# Patient Record
Sex: Male | Born: 1990 | Race: Black or African American | Hispanic: No | Marital: Single | Smoking: Current every day smoker
Health system: Southern US, Community
[De-identification: ages and names within clinical notes are randomized; demographics above are authoritative.]

## PROBLEM LIST (undated history)

## (undated) DIAGNOSIS — R569 Unspecified convulsions: Secondary | ICD-10-CM

## (undated) HISTORY — PX: NO PAST SURGERIES: SHX2092

---

## 2013-07-30 ENCOUNTER — Emergency Department (HOSPITAL_COMMUNITY)
Admission: EM | Admit: 2013-07-30 | Discharge: 2013-07-30 | Disposition: A | Discharge status: Home / Self Care | Payer: 59 | Attending: Emergency Medicine | Admitting: Emergency Medicine

## 2013-07-30 ENCOUNTER — Encounter (HOSPITAL_COMMUNITY): Payer: Self-pay | Admitting: Emergency Medicine

## 2013-07-30 DIAGNOSIS — Y939 Activity, unspecified: Secondary | ICD-10-CM | POA: Insufficient documentation

## 2013-07-30 DIAGNOSIS — IMO0002 Reserved for concepts with insufficient information to code with codable children: Secondary | ICD-10-CM | POA: Insufficient documentation

## 2013-07-30 DIAGNOSIS — Y929 Unspecified place or not applicable: Secondary | ICD-10-CM | POA: Insufficient documentation

## 2013-07-30 DIAGNOSIS — F17200 Nicotine dependence, unspecified, uncomplicated: Secondary | ICD-10-CM | POA: Insufficient documentation

## 2013-07-30 DIAGNOSIS — R296 Repeated falls: Secondary | ICD-10-CM | POA: Insufficient documentation

## 2013-07-30 DIAGNOSIS — R112 Nausea with vomiting, unspecified: Secondary | ICD-10-CM | POA: Insufficient documentation

## 2013-07-30 DIAGNOSIS — R569 Unspecified convulsions: Secondary | ICD-10-CM | POA: Insufficient documentation

## 2013-07-30 HISTORY — DX: Unspecified convulsions: R56.9

## 2013-07-30 LAB — I-STAT CHEM 8, ED
BUN: 12 mg/dL (ref 6–23)
Calcium, Ion: 1.12 mmol/L (ref 1.12–1.23)
Chloride: 105 mEq/L (ref 96–112)
Creatinine, Ser: 1 mg/dL (ref 0.50–1.35)
Glucose, Bld: 82 mg/dL (ref 70–99)
HCT: 46 % (ref 39.0–52.0)
Hemoglobin: 15.6 g/dL (ref 13.0–17.0)
Potassium: 3.7 mEq/L (ref 3.7–5.3)
Sodium: 141 mEq/L (ref 137–147)
TCO2: 22 mmol/L (ref 0–100)

## 2013-07-30 MED ORDER — KETOROLAC TROMETHAMINE 30 MG/ML IJ SOLN
30.0000 mg | Freq: Once | INTRAMUSCULAR | Status: AC
  Administered 2013-07-30: 30 mg via INTRAVENOUS
  Filled 2013-07-30: qty 1

## 2013-07-30 MED ORDER — LEVETIRACETAM 500 MG PO TABS: 500.0000 mg | ORAL_TABLET | Freq: Two times a day (BID) | ORAL | Status: DC

## 2013-07-30 MED ORDER — ONDANSETRON HCL 4 MG/2ML IJ SOLN
4.0000 mg | Freq: Once | INTRAMUSCULAR | Status: AC
  Administered 2013-07-30: 4 mg via INTRAVENOUS
  Filled 2013-07-30: qty 2

## 2013-07-30 MED ORDER — LEVETIRACETAM 500 MG PO TABS
1000.0000 mg | ORAL_TABLET | Freq: Once | ORAL | Status: AC
  Administered 2013-07-30: 1000 mg via ORAL
  Filled 2013-07-30 (×2): qty 2

## 2013-07-30 MED ORDER — SODIUM CHLORIDE 0.9 % IV BOLUS (SEPSIS)
1000.0000 mL | Freq: Once | INTRAVENOUS | Status: AC
  Administered 2013-07-30: 1000 mL via INTRAVENOUS

## 2013-07-30 NOTE — ED Notes (Signed)
Bed: WA07 Expected date:  Expected time:  Means of arrival:  Comments: NV, zofran given

## 2013-07-30 NOTE — Discharge Instructions (Signed)
Driving and Equipment Restrictions Some medical problems make it dangerous to drive, ride a bike, or use machines. Some of these problems are:  A hard blow to the head (concussion).  Passing out (fainting).  Twitching and shaking (seizures).  Low blood sugar.  Taking medicine to help you relax (sedatives).  Taking pain medicines.  Wearing an eye patch.  Wearing splints. This can make it hard to use parts of your body that you need to drive safely. HOME CARE   Do not drive until your doctor says it is okay.  Do not use machines until your doctor says it is okay. You may need a form signed by your doctor (medical release) before you can drive again. You may also need this form before you do other tasks where you need to be fully alert. MAKE SURE YOU:  Understand these instructions.  Will watch your condition.  Will get help right away if you are not doing well or get worse. Document Released: 04/28/2004 Document Revised: 06/13/2011 Document Reviewed: 07/29/2009 [facility] Patient Information 2014 [place]ExitCare, [place].  Seizure, Adult A seizure means there is unusual activity in the brain. A seizure can cause changes in attention or behavior. Seizures often cause shaking (convulsions). Seizures often last from 30 seconds to 2 minutes. HOME CARE   If you are given medicines, take them exactly as told by your doctor.  Keep all doctor visits as told.  Do not swim or drive until your doctor says it is okay.  Teach others what to do if you have a seizure. They should:  Lay you on the ground.  Put a cushion under your head.  Loosen any tight clothing around your neck.  Turn you on your side.  Stay with you until you get better. GET HELP RIGHT AWAY IF:   The seizure lasts longer than 2 to 5 minutes.  The seizure is very bad.  The person does not wake up after the seizure.  The person's attention or behavior changes. Drive the person to the emergency room or call your  local emergency services (911 in [place] MAKE SURE YOU:   Understand these instructions.  Will watch your condition.  Will get help right away if you are not doing well or get worse. Document Released: 09/07/2007 Document Revised: 06/13/2011 Document Reviewed: 03/09/2011 [facility] Patient Information 2014 [place], [place].

## 2013-07-30 NOTE — Progress Notes (Signed)
  CARE MANAGEMENT ED NOTE 07/30/2013  Patient:  Jesus Bryan,Jesus Bryan   Account Number:  [id number]  Date Initiated:  07/30/2013  Documentation initiated by:  Radford PaxFERRERO,Melroy Bougher  Subjective/Objective Assessment:   Patient presents to Ed post seizure     Subjective/Objective Assessment Detail:     Action/Plan:   Action/Plan Detail:   Anticipated DC Date:  07/30/2013     Status Recommendation to Physician:   Result of Recommendation:    Other ED Services  Consult Working Plan    DC Planning Services  Other  PCP issues    Choice offered to / List presented to:            Status of service:  Completed, signed off  ED Comments:   ED Comments Detail:  EDCM spoke to patient at bedside.  EDCM has noted patient's Medicaid is out of state.  [facility] he will need to call the DSS to have his Medicaid insurnace changed to [place].  However, the patient seems to be having difficulty in finding his words post seizure.  Patient reaching for his wallet and calling it water.  EDRN in the room providing patient pain and nausea medicine.  Patient will need reinforcement.  No further EDCM needs at this time.

## 2013-07-30 NOTE — ED Provider Notes (Signed)
CSN: [id number]     Arrival date & time 07/30/13  1746 History   First MD Initiated Contact with Patient 07/30/13 1810     Chief Complaint  Patient presents with  . Seizures  . Nausea  . Emesis     (Consider location/radiation/quality/duration/timing/severity/associated sxs/prior Treatment) HPI  23 year old male brought in by EMS after likely seizure. He has a history the same. Patient was briefly incarcerated for a few days and during this time did not take his Keppra. He fell during this event is has abrasion to his chin. Aside from some mild pain there, he currently has no complaints. No fever or chills. No HA. No neck pain/stiffness.   Past Medical History  Diagnosis Date  . Seizures    History reviewed. No pertinent past surgical history. No family history on file. History  Substance Use Topics  . Smoking status: Current Some Day Smoker  . Smokeless tobacco: Not on file  . Alcohol Use: Yes     Comment: occasionally     Review of Systems  All systems reviewed and negative, other than as noted in HPI.   Allergies  Review of patient's allergies indicates no known allergies.  Home Medications   Prior to Admission medications   Not on File   BP 121/68  Pulse 60  Temp(Src) 99.1 F (37.3 C) (Oral)  Resp 16  SpO2 100% Physical Exam  Nursing note and vitals reviewed. Constitutional: He is oriented to person, place, and time. He appears well-developed and well-nourished. No distress.  HENT:  Head: Normocephalic.  Abrasion to the chin. No significant bony tenderness.  Eyes: Conjunctivae are normal. Right eye exhibits no discharge. Left eye exhibits no discharge.  Neck: Neck supple.  No nuchal rigidity  Cardiovascular: Normal rate, regular rhythm and normal heart sounds.  Exam reveals no gallop and no friction rub.   No murmur heard. Pulmonary/Chest: Effort normal and breath sounds normal. No respiratory distress.  Abdominal: Soft. He exhibits no distension.  There is no tenderness.  Musculoskeletal: He exhibits no edema and no tenderness.  Neurological: He is alert and oriented to person, place, and time. No cranial nerve deficit. He exhibits normal muscle tone. Coordination normal.  Speech clear. Content appropriate. Follows commands. Cranial nerves intact. Strength 5 out of 5 bilateral upper lower extremities. Good units testing bilaterally. Gait is steady.  Skin: Skin is warm and dry.  Psychiatric: He has a normal mood and affect. His behavior is normal. Thought content normal.    ED Course  Procedures (including critical care time) Labs Review Labs Reviewed  I-STAT CHEM 8, ED    Imaging Review No results found.   EKG Interpretation None      MDM   Final diagnoses:  Seizure    23 year old male with a seizure. History the same. Back to baseline. Nonfocal neuro exam. Likely secondary to medication noncompliance while incarcerated. She was loaded with Keppra the emergency room. He is provided with a prescription. Return precautions were discussed. Outpatient followup otherwise.    Raeford RazorStephen Shaquella Stamant, MD 08/10/13 2018

## 2013-07-30 NOTE — ED Notes (Signed)
Pt reports that he has been in jail for 2 days and was not give his Keppra.
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
## 2013-10-17 ENCOUNTER — Emergency Department (HOSPITAL_COMMUNITY): Payer: Medicaid Other

## 2013-10-17 ENCOUNTER — Ambulatory Visit (HOSPITAL_COMMUNITY)
Admission: EM | Admit: 2013-10-17 | Discharge: 2013-10-17 | Disposition: A | Discharge status: Home / Self Care | Payer: Medicaid Other | Attending: Emergency Medicine | Admitting: Emergency Medicine

## 2013-10-17 ENCOUNTER — Encounter (HOSPITAL_COMMUNITY): Payer: Self-pay | Admitting: Emergency Medicine

## 2013-10-17 ENCOUNTER — Encounter (HOSPITAL_COMMUNITY)
Admission: EM | Disposition: A | Discharge status: Home / Self Care | Payer: Self-pay | Source: Home / Self Care | Attending: Emergency Medicine

## 2013-10-17 ENCOUNTER — Encounter (HOSPITAL_COMMUNITY): Payer: Medicaid Other | Admitting: Anesthesiology

## 2013-10-17 ENCOUNTER — Emergency Department (HOSPITAL_COMMUNITY): Payer: Medicaid Other | Admitting: Anesthesiology

## 2013-10-17 DIAGNOSIS — S61411A Laceration without foreign body of right hand, initial encounter: Secondary | ICD-10-CM

## 2013-10-17 DIAGNOSIS — S66921A Laceration of unspecified muscle, fascia and tendon at wrist and hand level, right hand, initial encounter: Secondary | ICD-10-CM

## 2013-10-17 DIAGNOSIS — S61412A Laceration without foreign body of left hand, initial encounter: Secondary | ICD-10-CM

## 2013-10-17 DIAGNOSIS — Z79899 Other long term (current) drug therapy: Secondary | ICD-10-CM | POA: Insufficient documentation

## 2013-10-17 DIAGNOSIS — S65509A Unspecified injury of blood vessel of unspecified finger, initial encounter: Secondary | ICD-10-CM | POA: Insufficient documentation

## 2013-10-17 DIAGNOSIS — S61209A Unspecified open wound of unspecified finger without damage to nail, initial encounter: Secondary | ICD-10-CM | POA: Diagnosis not present

## 2013-10-17 DIAGNOSIS — F17200 Nicotine dependence, unspecified, uncomplicated: Secondary | ICD-10-CM | POA: Insufficient documentation

## 2013-10-17 HISTORY — DX: Unspecified convulsions: R56.9

## 2013-10-17 HISTORY — PX: NERVE, TENDON AND ARTERY REPAIR: SHX5695

## 2013-10-17 LAB — BASIC METABOLIC PANEL
Anion gap: 19 — ABNORMAL HIGH (ref 5–15)
BUN: 11 mg/dL (ref 6–23)
CO2: 21 mEq/L (ref 19–32)
Calcium: 9.6 mg/dL (ref 8.4–10.5)
Chloride: 102 mEq/L (ref 96–112)
Creatinine, Ser: 1.13 mg/dL (ref 0.50–1.35)
GFR calc Af Amer: >90 mL/min (ref >90)
GFR calc non Af Amer: 90 mL/min — ABNORMAL LOW (ref >90)
GLUCOSE: 106 mg/dL — AB (ref 70–99)
POTASSIUM: 3.3 meq/L — AB (ref 3.7–5.3)
SODIUM: 142 meq/L (ref 137–147)

## 2013-10-17 LAB — CBC WITH DIFFERENTIAL/PLATELET
BASOS ABS: 0.1 10*3/uL (ref 0.0–0.1)
Basophils Relative: 1 % (ref 0–1)
EOS ABS: 0 10*3/uL (ref 0.0–0.7)
EOS PCT: 0 % (ref 0–5)
HCT: 37.1 % — ABNORMAL LOW (ref 39.0–52.0)
Hemoglobin: 13 g/dL (ref 13.0–17.0)
LYMPHS ABS: 3.5 10*3/uL (ref 0.7–4.0)
LYMPHS PCT: 47 % — AB (ref 12–46)
MCH: 29.1 pg (ref 26.0–34.0)
MCHC: 35 g/dL (ref 30.0–36.0)
MCV: 83.2 fL (ref 78.0–100.0)
Monocytes Absolute: 0.6 10*3/uL (ref 0.1–1.0)
Monocytes Relative: 7 % (ref 3–12)
NEUTROS PCT: 45 % (ref 43–77)
Neutro Abs: 3.3 10*3/uL (ref 1.7–7.7)
PLATELETS: 231 10*3/uL (ref 150–400)
RBC: 4.46 MIL/uL (ref 4.22–5.81)
RDW: 12.9 % (ref 11.5–15.5)
WBC: 7.4 10*3/uL (ref 4.0–10.5)

## 2013-10-17 LAB — HEMOGLOBIN AND HEMATOCRIT, BLOOD
HEMATOCRIT: 29.7 % — AB (ref 39.0–52.0)
Hemoglobin: 10.7 g/dL — ABNORMAL LOW (ref 13.0–17.0)

## 2013-10-17 SURGERY — NERVE, TENDON AND ARTERY REPAIR
Anesthesia: General | Site: Hand | Laterality: Bilateral

## 2013-10-17 MED ORDER — GLYCOPYRROLATE 0.2 MG/ML IJ SOLN
INTRAMUSCULAR | Status: DC | PRN
  Administered 2013-10-17: 0.4 mg via INTRAVENOUS

## 2013-10-17 MED ORDER — MORPHINE SULFATE 4 MG/ML IJ SOLN
4.0000 mg | Freq: Once | INTRAMUSCULAR | Status: AC
  Administered 2013-10-17: 4 mg via INTRAVENOUS
  Filled 2013-10-17: qty 1

## 2013-10-17 MED ORDER — ONDANSETRON HCL 4 MG/2ML IJ SOLN
INTRAMUSCULAR | Status: AC
  Filled 2013-10-17: qty 2

## 2013-10-17 MED ORDER — CEFAZOLIN SODIUM-DEXTROSE 2-3 GM-% IV SOLR
INTRAVENOUS | Status: AC
  Filled 2013-10-17: qty 100

## 2013-10-17 MED ORDER — PHENYLEPHRINE HCL 10 MG/ML IJ SOLN
10.0000 mg | INTRAVENOUS | Status: DC | PRN
  Administered 2013-10-17: 50 ug/min via INTRAVENOUS

## 2013-10-17 MED ORDER — LIDOCAINE HCL (PF) 1 % IJ SOLN
INTRAMUSCULAR | Status: AC
  Filled 2013-10-17: qty 30

## 2013-10-17 MED ORDER — ROCURONIUM BROMIDE 100 MG/10ML IV SOLN
INTRAVENOUS | Status: DC | PRN
  Administered 2013-10-17: 40 mg via INTRAVENOUS

## 2013-10-17 MED ORDER — 0.9 % SODIUM CHLORIDE (POUR BTL) OPTIME
TOPICAL | Status: DC | PRN
  Administered 2013-10-17 (×3): 1000 mL

## 2013-10-17 MED ORDER — MIDAZOLAM HCL 2 MG/2ML IJ SOLN
INTRAMUSCULAR | Status: AC
  Filled 2013-10-17: qty 2

## 2013-10-17 MED ORDER — HEPARIN SODIUM (PORCINE) 1000 UNIT/ML IJ SOLN
INTRAMUSCULAR | Status: AC
  Filled 2013-10-17: qty 1

## 2013-10-17 MED ORDER — BUPIVACAINE HCL (PF) 0.25 % IJ SOLN
INTRAMUSCULAR | Status: DC | PRN
  Administered 2013-10-17: 17 mL

## 2013-10-17 MED ORDER — NEOSTIGMINE METHYLSULFATE 10 MG/10ML IV SOLN
INTRAVENOUS | Status: AC
  Filled 2013-10-17: qty 1

## 2013-10-17 MED ORDER — PROPOFOL 10 MG/ML IV BOLUS
INTRAVENOUS | Status: DC | PRN
  Administered 2013-10-17: 60 mg via INTRAVENOUS
  Administered 2013-10-17: 140 mg via INTRAVENOUS

## 2013-10-17 MED ORDER — MIDAZOLAM HCL 5 MG/5ML IJ SOLN
INTRAMUSCULAR | Status: DC | PRN
  Administered 2013-10-17: 2 mg via INTRAVENOUS

## 2013-10-17 MED ORDER — HYDROMORPHONE HCL PF 1 MG/ML IJ SOLN
INTRAMUSCULAR | Status: AC
  Filled 2013-10-17: qty 1

## 2013-10-17 MED ORDER — NEOSTIGMINE METHYLSULFATE 10 MG/10ML IV SOLN
INTRAVENOUS | Status: DC | PRN
  Administered 2013-10-17: 3 mg via INTRAVENOUS

## 2013-10-17 MED ORDER — BUPIVACAINE HCL (PF) 0.25 % IJ SOLN
INTRAMUSCULAR | Status: AC
  Filled 2013-10-17: qty 30

## 2013-10-17 MED ORDER — CEFAZOLIN SODIUM 1-5 GM-% IV SOLN
1.0000 g | Freq: Once | INTRAVENOUS | Status: AC
  Administered 2013-10-17: 1 g via INTRAVENOUS
  Filled 2013-10-17: qty 50

## 2013-10-17 MED ORDER — OXYCODONE-ACETAMINOPHEN 5-325 MG PO TABS: ORAL_TABLET | ORAL | Status: DC

## 2013-10-17 MED ORDER — ARTIFICIAL TEARS OP OINT
TOPICAL_OINTMENT | OPHTHALMIC | Status: AC
  Filled 2013-10-17: qty 3.5

## 2013-10-17 MED ORDER — TETANUS-DIPHTH-ACELL PERTUSSIS 5-2.5-18.5 LF-MCG/0.5 IM SUSP
0.5000 mL | Freq: Once | INTRAMUSCULAR | Status: AC
  Administered 2013-10-17: 0.5 mL via INTRAMUSCULAR
  Filled 2013-10-17: qty 0.5

## 2013-10-17 MED ORDER — PHENYLEPHRINE HCL 10 MG/ML IJ SOLN
INTRAMUSCULAR | Status: DC | PRN
  Administered 2013-10-17 (×2): 80 ug via INTRAVENOUS
  Administered 2013-10-17: 120 ug via INTRAVENOUS
  Administered 2013-10-17: 80 ug via INTRAVENOUS
  Administered 2013-10-17: 40 ug via INTRAVENOUS

## 2013-10-17 MED ORDER — EPHEDRINE SULFATE 50 MG/ML IJ SOLN
INTRAMUSCULAR | Status: DC | PRN
  Administered 2013-10-17 (×2): 5 mg via INTRAVENOUS

## 2013-10-17 MED ORDER — LACTATED RINGERS IV SOLN
INTRAVENOUS | Status: DC | PRN
  Administered 2013-10-17 (×3): via INTRAVENOUS

## 2013-10-17 MED ORDER — ONDANSETRON HCL 4 MG/2ML IJ SOLN
INTRAMUSCULAR | Status: DC | PRN
  Administered 2013-10-17: 4 mg via INTRAVENOUS

## 2013-10-17 MED ORDER — PROPOFOL 10 MG/ML IV BOLUS
INTRAVENOUS | Status: AC
  Filled 2013-10-17: qty 20

## 2013-10-17 MED ORDER — FENTANYL CITRATE 0.05 MG/ML IJ SOLN
INTRAMUSCULAR | Status: DC | PRN
  Administered 2013-10-17 (×2): 100 ug via INTRAVENOUS
  Administered 2013-10-17: 50 ug via INTRAVENOUS

## 2013-10-17 MED ORDER — GLYCOPYRROLATE 0.2 MG/ML IJ SOLN
INTRAMUSCULAR | Status: AC
  Filled 2013-10-17: qty 2

## 2013-10-17 MED ORDER — CEFAZOLIN SODIUM-DEXTROSE 2-3 GM-% IV SOLR
INTRAVENOUS | Status: DC | PRN
  Administered 2013-10-17: 2 g via INTRAVENOUS

## 2013-10-17 MED ORDER — SODIUM CHLORIDE 0.9 % IV BOLUS (SEPSIS)
1000.0000 mL | Freq: Once | INTRAVENOUS | Status: AC
  Administered 2013-10-17: 1000 mL via INTRAVENOUS

## 2013-10-17 MED ORDER — HYDROMORPHONE HCL PF 1 MG/ML IJ SOLN
INTRAMUSCULAR | Status: DC | PRN
  Administered 2013-10-17: 1 mg via INTRAVENOUS

## 2013-10-17 MED ORDER — LIDOCAINE HCL (PF) 1 % IJ SOLN
INTRAVENOUS | Status: DC
  Filled 2013-10-17: qty 10

## 2013-10-17 MED ORDER — LIDOCAINE HCL (CARDIAC) 20 MG/ML IV SOLN
INTRAVENOUS | Status: DC | PRN
  Administered 2013-10-17: 50 mg via INTRAVENOUS

## 2013-10-17 MED ORDER — ONDANSETRON HCL 4 MG/2ML IJ SOLN: INTRAMUSCULAR | Status: DC | PRN

## 2013-10-17 MED ORDER — ROCURONIUM BROMIDE 50 MG/5ML IV SOLN
INTRAVENOUS | Status: AC
  Filled 2013-10-17: qty 1

## 2013-10-17 MED ORDER — PHENYLEPHRINE HCL 10 MG/ML IJ SOLN
INTRAMUSCULAR | Status: AC
  Filled 2013-10-17: qty 1

## 2013-10-17 MED ORDER — ASPIRIN EC 325 MG PO TBEC: 325.0000 mg | DELAYED_RELEASE_TABLET | Freq: Every day | ORAL | Status: DC

## 2013-10-17 MED ORDER — FENTANYL CITRATE 0.05 MG/ML IJ SOLN
INTRAMUSCULAR | Status: AC
  Filled 2013-10-17: qty 5

## 2013-10-17 SURGICAL SUPPLY — 37 items
BANDAGE ELASTIC 4 VELCRO ST LF (GAUZE/BANDAGES/DRESSINGS) ×6 IMPLANT
BANDAGE GAUZE ELAST BULKY 4 IN (GAUZE/BANDAGES/DRESSINGS) ×6 IMPLANT
BLADE MINI RND TIP GREEN BEAV (BLADE) IMPLANT
BLADE SURG 15 STRL LF DISP TIS (BLADE) IMPLANT
BLADE SURG 15 STRL SS (BLADE)
BNDG ESMARK 4X9 LF (GAUZE/BANDAGES/DRESSINGS) ×3 IMPLANT
BNDG GAUZE ELAST 4 BULKY (GAUZE/BANDAGES/DRESSINGS) ×3 IMPLANT
CORDS BIPOLAR (ELECTRODE) ×3 IMPLANT
CUFF TOURNIQUET SINGLE 18IN (TOURNIQUET CUFF) ×3 IMPLANT
DECANTER SPIKE VIAL GLASS SM (MISCELLANEOUS) IMPLANT
DRAPE EXTREMITY T 121X128X90 (DRAPE) ×3 IMPLANT
DRAPE SURG 17X23 STRL (DRAPES) ×3 IMPLANT
GAUZE XEROFORM 1X8 LF (GAUZE/BANDAGES/DRESSINGS) ×6 IMPLANT
GLOVE BIO SURGEON STRL SZ7.5 (GLOVE) ×3 IMPLANT
GLOVE BIOGEL PI IND STRL 8 (GLOVE) ×1 IMPLANT
GLOVE BIOGEL PI INDICATOR 8 (GLOVE) ×2
GOWN STRL REUS W/ TWL XL LVL3 (GOWN DISPOSABLE) ×1 IMPLANT
GOWN STRL REUS W/TWL XL LVL3 (GOWN DISPOSABLE) ×2
KIT BASIN OR (CUSTOM PROCEDURE TRAY) ×3 IMPLANT
NDL SAFETY ECLIPSE 18X1.5 (NEEDLE) IMPLANT
NEEDLE HYPO 18GX1.5 SHARP (NEEDLE)
NEEDLE HYPO 25X1 1.5 SAFETY (NEEDLE) IMPLANT
NS IRRIG 1000ML POUR BTL (IV SOLUTION) ×3 IMPLANT
PACK ORTHO EXTREMITY (CUSTOM PROCEDURE TRAY) ×3 IMPLANT
SPEAR EYE SURG WECK-CEL (MISCELLANEOUS) ×3 IMPLANT
SPONGE GAUZE 4X4 12PLY (GAUZE/BANDAGES/DRESSINGS) ×6 IMPLANT
STOCKINETTE 4X48 STRL (DRAPES) IMPLANT
SUT ETHILON 4 0 P 3 18 (SUTURE) ×3 IMPLANT
SUT ETHILON 4 0 PS 2 18 (SUTURE) ×6 IMPLANT
SUT ETHILON 9 0 BV130 4 (SUTURE) ×9 IMPLANT
SUT MERSILENE 4 0 P 3 (SUTURE) ×3 IMPLANT
SUT VIC AB 3-0 SH 27 (SUTURE) ×2
SUT VIC AB 3-0 SH 27XBRD (SUTURE) ×1 IMPLANT
SUT VIC AB 3-0 X1 27 (SUTURE) ×3 IMPLANT
SYR BULB 3OZ (MISCELLANEOUS) ×3 IMPLANT
TOWEL OR 17X24 6PK STRL BLUE (TOWEL DISPOSABLE) ×6 IMPLANT
UNDERPAD 30X30 INCONTINENT (UNDERPADS AND DIAPERS) ×6 IMPLANT

## 2013-10-17 NOTE — Brief Op Note (Signed)
10/17/2013  7:47 PM  PATIENT:  Walker KehrWilly Xxxwelson  23 y.o. male  PRE-OPERATIVE DIAGNOSIS:  bilateral hand lacerations  POST-OPERATIVE DIAGNOSIS:  bilateral hand lacerations  PROCEDURE:  Right hand exploration wound, repair princeps pollicis artery x 2, repair thenar musculature; left hand exploration wound, repair EDC to small finger and EDC to ring finger at dorsum of mp joints including ulnar sagittal bands  SURGEON:  Surgeon(s) and Role:    * Tami RibasKevin R Dmarius Reeder, MD - Primary    * Marlowe ShoresMatthew A Weingold, MD - Assisting  PHYSICIAN ASSISTANT:   ASSISTANTS: Dairl PonderMatthew Weingold, MD   ANESTHESIA:   general  EBL:     BLOOD ADMINISTERED:none  DRAINS: none   LOCAL MEDICATIONS USED:  MARCAINE     SPECIMEN:  No Specimen  DISPOSITION OF SPECIMEN:  N/A  COUNTS:  YES  TOURNIQUET:   Total Tourniquet Time Documented: Upper Arm (Right) - 89 minutes Total: Upper Arm (Right) - 89 minutes   DICTATION: .Other Dictation: Dictation Number [id number]  PLAN OF CARE: Discharge to home after PACU  PATIENT DISPOSITION:  PACU - hemodynamically stable.
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
## 2013-10-17 NOTE — Discharge Instructions (Signed)
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
## 2013-10-17 NOTE — Anesthesia Preprocedure Evaluation (Signed)
Anesthesia Evaluation  Patient identified by MRN, date of birth, ID band Patient awake    Reviewed: Allergy & Precautions, H&P , NPO status , Patient's Chart, lab work & pertinent test results  History of Anesthesia Complications Negative for: history of anesthetic complications  Airway Mallampati: I TM Distance: >3 FB Neck ROM: Full    Dental  (+) Teeth Intact,    Pulmonary neg shortness of breath, neg sleep apnea, neg COPDneg recent URI, Current Smoker,  breath sounds clear to auscultation        Cardiovascular negative cardio ROS  Rhythm:Regular     Neuro/Psych Seizures -, Well Controlled,  negative psych ROS   GI/Hepatic negative GI ROS, Neg liver ROS,   Endo/Other  negative endocrine ROS  Renal/GU negative Renal ROS     Musculoskeletal   Abdominal   Peds  Hematology negative hematology ROS (+)   Anesthesia Other Findings   Reproductive/Obstetrics                           Anesthesia Physical Anesthesia Plan  ASA: II  Anesthesia Plan: General   Post-op Pain Management:    Induction: Intravenous  Airway Management Planned: LMA and Oral ETT  Additional Equipment: None  Intra-op Plan:   Post-operative Plan: Extubation in OR  Informed Consent: I have reviewed the patients History and Physical, chart, labs and discussed the procedure including the risks, benefits and alternatives for the proposed anesthesia with the patient or authorized representative who has indicated his/her understanding and acceptance.   Dental advisory given  Plan Discussed with: CRNA and Surgeon  Anesthesia Plan Comments:         Anesthesia Quick Evaluation

## 2013-10-17 NOTE — Op Note (Signed)
[id number] 

## 2013-10-17 NOTE — Progress Notes (Signed)
Social work paged again

## 2013-10-17 NOTE — ED Notes (Signed)
Pneumatic tourniquet inflated.
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
## 2013-10-17 NOTE — Anesthesia Postprocedure Evaluation (Signed)
  Anesthesia Post-op Note  Patient: Jesus Bryan  Procedure(s) Performed: Procedure(s): EXPLORATION BILATERAL HAND LACERATIONS, RIGHT HAND REPAIR OF PRINCEPS POLLICIS ARTERY X2 AND REPAIR THENAR MUSCULATURE.  LEFT HAND REPAIR OF SMALL AND RING FINGER EXTENSOR TENDONS. (Bilateral)  Patient Location: PACU  Anesthesia Type: General   Level of Consciousness: awake, alert  and oriented  Airway and Oxygen Therapy: Patient Spontanous Breathing  Post-op Pain: None   Post-op Assessment: Post-op Vital signs reviewed  Post-op Vital Signs: Reviewed  Last Vitals:  Filed Vitals:   10/17/13 2130  BP: 121/70  Pulse: 71  Temp:   Resp: 13    Complications: No apparent anesthesia complications

## 2013-10-17 NOTE — ED Notes (Signed)
Dr Kuzma at bedside. 

## 2013-10-17 NOTE — ED Notes (Signed)
Pneumatic cuff deflated. Distal pulse intact. Bleeding controlled, dressing applied.

## 2013-10-17 NOTE — Transfer of Care (Signed)
Immediate Anesthesia Transfer of Care Note  Patient: Jesus Bryan  Procedure(s) Performed: Procedure(s): EXPLORATION BILATERAL HAND LACERATIONS,  (Bilateral)  Patient Location: PACU  Anesthesia Type:General  Level of Consciousness: responds to stimulation  Airway & Oxygen Therapy: Patient Spontanous Breathing and Patient connected to nasal cannula oxygen  Post-op Assessment: Report given to PACU RN, Post -op Vital signs reviewed and stable and Patient moving all extremities  Post vital signs: Reviewed and stable  Complications: No apparent anesthesia complications

## 2013-10-18 ENCOUNTER — Encounter (HOSPITAL_COMMUNITY): Payer: Self-pay | Admitting: Orthopedic Surgery
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
## 2013-10-18 NOTE — Addendum Note (Signed)
Addendum created 10/18/13 1227 by Corky Soxhris Darien Kading, MD   Modules edited: Anesthesia Attestations
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
## 2013-10-21 ENCOUNTER — Encounter (HOSPITAL_COMMUNITY): Payer: Self-pay | Admitting: Emergency Medicine

## 2013-10-21 DIAGNOSIS — Z4801 Encounter for change or removal of surgical wound dressing: Secondary | ICD-10-CM | POA: Diagnosis not present

## 2013-10-21 DIAGNOSIS — G40909 Epilepsy, unspecified, not intractable, without status epilepticus: Secondary | ICD-10-CM | POA: Diagnosis not present

## 2013-10-21 DIAGNOSIS — F17200 Nicotine dependence, unspecified, uncomplicated: Secondary | ICD-10-CM | POA: Diagnosis not present

## 2013-10-21 DIAGNOSIS — M79609 Pain in unspecified limb: Secondary | ICD-10-CM | POA: Diagnosis present

## 2013-10-21 DIAGNOSIS — Z79899 Other long term (current) drug therapy: Secondary | ICD-10-CM | POA: Diagnosis not present
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
## 2013-10-22 ENCOUNTER — Encounter (HOSPITAL_COMMUNITY): Payer: Self-pay | Admitting: Orthopedic Surgery

## 2013-10-22 ENCOUNTER — Emergency Department (HOSPITAL_COMMUNITY)
Admission: EM | Admit: 2013-10-22 | Discharge: 2013-10-22 | Disposition: A | Discharge status: Home / Self Care | Payer: Medicaid Other | Attending: Emergency Medicine | Admitting: Emergency Medicine

## 2013-10-22 DIAGNOSIS — Z5189 Encounter for other specified aftercare: Secondary | ICD-10-CM
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
## 2013-10-22 NOTE — ED Notes (Signed)
Dressing removed per MD request.

## 2013-10-29 ENCOUNTER — Emergency Department (HOSPITAL_COMMUNITY)
Admission: EM | Admit: 2013-10-29 | Discharge: 2013-10-29 | Disposition: A | Discharge status: Home / Self Care | Payer: Medicaid Other | Attending: Emergency Medicine | Admitting: Emergency Medicine

## 2013-10-29 ENCOUNTER — Encounter (HOSPITAL_COMMUNITY): Payer: Self-pay | Admitting: Emergency Medicine

## 2013-10-29 DIAGNOSIS — Z4801 Encounter for change or removal of surgical wound dressing: Secondary | ICD-10-CM | POA: Diagnosis present

## 2013-10-29 DIAGNOSIS — G40909 Epilepsy, unspecified, not intractable, without status epilepticus: Secondary | ICD-10-CM | POA: Diagnosis not present

## 2013-10-29 DIAGNOSIS — Z7982 Long term (current) use of aspirin: Secondary | ICD-10-CM | POA: Diagnosis not present

## 2013-10-29 DIAGNOSIS — F17200 Nicotine dependence, unspecified, uncomplicated: Secondary | ICD-10-CM | POA: Diagnosis not present

## 2013-10-29 DIAGNOSIS — Z79899 Other long term (current) drug therapy: Secondary | ICD-10-CM | POA: Insufficient documentation

## 2013-10-29 DIAGNOSIS — Z5189 Encounter for other specified aftercare: Secondary | ICD-10-CM
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
## 2013-10-29 NOTE — ED Notes (Signed)
Pt here for wound check to lacerations bilateral hands, sutures are still in, no swelling or drainage noted. pts main request is a work note.

## 2013-10-29 NOTE — ED Notes (Signed)
PT here for letter approving him to return to work. PT has sutures in R hand between thumb and index finger. PT denies pain and parasthesia and is ready to get back to work. Sutures still present and PT states MD said they would dissolve on their own.

## 2013-10-29 NOTE — Discharge Instructions (Signed)
Follow up with Dr. Merlyn LotKuzma for further evaluation and management of your wounds.

## 2013-10-30 ENCOUNTER — Ambulatory Visit: Payer: Medicaid Other | Admitting: Occupational Therapy

## 2013-10-31 ENCOUNTER — Ambulatory Visit: Payer: Medicaid Other | Admitting: Occupational Therapy

## 2013-11-04 NOTE — ED Provider Notes (Signed)
Medical screening examination/treatment/procedure(s) were performed by non-physician practitioner and as supervising physician I was immediately available for consultation/collaboration.   EKG Interpretation None        Lyanne CoKevin M Celita Aron, MD 11/04/13 [phone]

## 2013-11-06 ENCOUNTER — Ambulatory Visit: Payer: Medicaid Other | Attending: Orthopedic Surgery | Admitting: Occupational Therapy

## 2013-11-06 DIAGNOSIS — IMO0001 Reserved for inherently not codable concepts without codable children: Secondary | ICD-10-CM | POA: Insufficient documentation

## 2013-11-06 DIAGNOSIS — M2560 Stiffness of unspecified joint, not elsewhere classified: Secondary | ICD-10-CM | POA: Insufficient documentation

## 2013-11-25 ENCOUNTER — Ambulatory Visit: Payer: Medicaid Other | Admitting: Occupational Therapy

## 2014-07-27 ENCOUNTER — Encounter (HOSPITAL_COMMUNITY): Payer: Self-pay | Admitting: Radiology

## 2014-07-27 ENCOUNTER — Emergency Department (HOSPITAL_COMMUNITY): Payer: Medicaid Other

## 2014-07-27 ENCOUNTER — Observation Stay (HOSPITAL_COMMUNITY)
Admission: EM | Admit: 2014-07-27 | Discharge: 2014-07-29 | Disposition: A | Discharge status: Home / Self Care | Payer: Medicaid Other | Attending: General Surgery | Admitting: General Surgery

## 2014-07-27 DIAGNOSIS — S71101A Unspecified open wound, right thigh, initial encounter: Secondary | ICD-10-CM | POA: Diagnosis present

## 2014-07-27 DIAGNOSIS — D62 Acute posthemorrhagic anemia: Secondary | ICD-10-CM | POA: Insufficient documentation

## 2014-07-27 DIAGNOSIS — Z87891 Personal history of nicotine dependence: Secondary | ICD-10-CM | POA: Diagnosis not present

## 2014-07-27 DIAGNOSIS — W3400XA Accidental discharge from unspecified firearms or gun, initial encounter: Secondary | ICD-10-CM

## 2014-07-27 LAB — CBC WITH DIFFERENTIAL/PLATELET
BASOS ABS: 0 10*3/uL (ref 0.0–0.1)
BASOS PCT: 0 % (ref 0–1)
EOS ABS: 0 10*3/uL (ref 0.0–0.7)
Eosinophils Relative: 0 % (ref 0–5)
HCT: 31.4 % — ABNORMAL LOW (ref 39.0–52.0)
Hemoglobin: 10.6 g/dL — ABNORMAL LOW (ref 13.0–17.0)
LYMPHS ABS: 2.6 10*3/uL (ref 0.7–4.0)
LYMPHS PCT: 30 % (ref 12–46)
MCH: 22.7 pg — AB (ref 26.0–34.0)
MCHC: 33.8 g/dL (ref 30.0–36.0)
MCV: 67.2 fL — ABNORMAL LOW (ref 78.0–100.0)
Monocytes Absolute: 1 10*3/uL (ref 0.1–1.0)
Monocytes Relative: 11 % (ref 3–12)
NEUTROS ABS: 5.1 10*3/uL (ref 1.7–7.7)
Neutrophils Relative %: 59 % (ref 43–77)
Platelets: 183 10*3/uL (ref 150–400)
RBC: 4.67 MIL/uL (ref 4.22–5.81)
RDW: 17 % — ABNORMAL HIGH (ref 11.5–15.5)
WBC: 8.7 10*3/uL (ref 4.0–10.5)

## 2014-07-27 LAB — ABO/RH: ABO/RH(D): A POS

## 2014-07-27 LAB — PREPARE FRESH FROZEN PLASMA
Unit division: 0
Unit division: 0

## 2014-07-27 LAB — COMPREHENSIVE METABOLIC PANEL
ALT: 22 U/L (ref 0–53)
AST: 37 U/L (ref 0–37)
Albumin: 3.5 g/dL (ref 3.5–5.2)
Alkaline Phosphatase: 39 U/L (ref 39–117)
Anion gap: 10 (ref 5–15)
BUN: 12 mg/dL (ref 6–23)
CALCIUM: 8.6 mg/dL (ref 8.4–10.5)
CHLORIDE: 105 mmol/L (ref 96–112)
CO2: 21 mmol/L (ref 19–32)
CREATININE: 1.35 mg/dL (ref 0.50–1.35)
GFR calc non Af Amer: 72 mL/min — ABNORMAL LOW (ref >90)
GFR, EST AFRICAN AMERICAN: 83 mL/min — AB (ref >90)
GLUCOSE: 153 mg/dL — AB (ref 70–99)
Potassium: 3.6 mmol/L (ref 3.5–5.1)
SODIUM: 136 mmol/L (ref 135–145)
Total Bilirubin: 0.5 mg/dL (ref 0.3–1.2)
Total Protein: 6.7 g/dL (ref 6.0–8.3)

## 2014-07-27 LAB — ETHANOL: Alcohol, Ethyl (B): <5 mg/dL (ref 0–9)

## 2014-07-27 LAB — CDS SEROLOGY

## 2014-07-27 LAB — PROTIME-INR
INR: 1.27 (ref 0.00–1.49)
PROTHROMBIN TIME: 16.1 s — AB (ref 11.6–15.2)

## 2014-07-27 MED ORDER — OXYCODONE HCL 5 MG PO TABS
5.0000 mg | ORAL_TABLET | ORAL | Status: DC | PRN
  Administered 2014-07-28: 5 mg via ORAL
  Filled 2014-07-27: qty 1

## 2014-07-27 MED ORDER — MORPHINE SULFATE 4 MG/ML IJ SOLN
4.0000 mg | INTRAMUSCULAR | Status: DC | PRN
  Administered 2014-07-27: 4 mg via INTRAVENOUS
  Filled 2014-07-27: qty 1

## 2014-07-27 MED ORDER — MORPHINE SULFATE 4 MG/ML IJ SOLN: 8.0000 mg | INTRAMUSCULAR | Status: DC | PRN

## 2014-07-27 MED ORDER — CEFAZOLIN SODIUM 1-5 GM-% IV SOLN
1.0000 g | Freq: Once | INTRAVENOUS | Status: AC
  Administered 2014-07-27: 1 g via INTRAVENOUS
  Filled 2014-07-27: qty 50

## 2014-07-27 MED ORDER — POTASSIUM CHLORIDE IN NACL 20-0.9 MEQ/L-% IV SOLN
INTRAVENOUS | Status: DC
  Administered 2014-07-27: 19:00:00 via INTRAVENOUS
  Filled 2014-07-27 (×2): qty 1000

## 2014-07-27 MED ORDER — OXYCODONE HCL 5 MG PO TABS
10.0000 mg | ORAL_TABLET | ORAL | Status: DC | PRN
  Administered 2014-07-28 (×2): 10 mg via ORAL
  Filled 2014-07-27 (×2): qty 2

## 2014-07-27 MED ORDER — TETANUS-DIPHTH-ACELL PERTUSSIS 5-2.5-18.5 LF-MCG/0.5 IM SUSP: 0.5000 mL | Freq: Once | INTRAMUSCULAR | Status: DC

## 2014-07-27 MED ORDER — ONDANSETRON HCL 4 MG PO TABS: 4.0000 mg | ORAL_TABLET | Freq: Four times a day (QID) | ORAL | Status: DC | PRN

## 2014-07-27 MED ORDER — IOHEXOL 350 MG/ML SOLN
100.0000 mL | Freq: Once | INTRAVENOUS | Status: AC | PRN
  Administered 2014-07-27: 100 mL via INTRAVENOUS

## 2014-07-27 MED ORDER — FENTANYL CITRATE (PF) 100 MCG/2ML IJ SOLN
50.0000 ug | Freq: Once | INTRAMUSCULAR | Status: AC
Start: 2014-07-27 — End: 2014-07-27
  Administered 2014-07-27: 50 ug via INTRAVENOUS

## 2014-07-27 MED ORDER — CEFAZOLIN SODIUM-DEXTROSE 2-3 GM-% IV SOLR: 2.0000 g | Freq: Once | INTRAVENOUS | Status: DC

## 2014-07-27 MED ORDER — HYDROMORPHONE HCL 1 MG/ML IJ SOLN
1.0000 mg | INTRAMUSCULAR | Status: DC | PRN
  Administered 2014-07-27 – 2014-07-29 (×8): 1 mg via INTRAVENOUS
  Filled 2014-07-27 (×9): qty 1

## 2014-07-27 MED ORDER — ONDANSETRON HCL 4 MG/2ML IJ SOLN: 4.0000 mg | Freq: Four times a day (QID) | INTRAMUSCULAR | Status: DC | PRN

## 2014-07-27 MED ORDER — ONDANSETRON HCL 4 MG/2ML IJ SOLN
INTRAMUSCULAR | Status: AC
  Administered 2014-07-27: 16:00:00
  Filled 2014-07-27: qty 2

## 2014-07-27 MED ORDER — TETANUS-DIPHTH-ACELL PERTUSSIS 5-2.5-18.5 LF-MCG/0.5 IM SUSP
0.5000 mL | Freq: Once | INTRAMUSCULAR | Status: AC
Start: 2014-07-27 — End: 2014-07-27
  Administered 2014-07-27: 0.5 mL via INTRAMUSCULAR
  Filled 2014-07-27: qty 0.5

## 2014-07-27 MED ORDER — OXYCODONE HCL 5 MG PO TABS: 2.5000 mg | ORAL_TABLET | ORAL | Status: DC | PRN

## 2014-07-27 MED ORDER — FENTANYL CITRATE (PF) 100 MCG/2ML IJ SOLN
INTRAMUSCULAR | Status: AC
  Filled 2014-07-27: qty 2

## 2014-07-27 MED ORDER — MORPHINE SULFATE 2 MG/ML IJ SOLN
1.0000 mg | INTRAMUSCULAR | Status: DC | PRN
Start: 2014-07-27 — End: 2014-07-28

## 2014-07-27 MED ORDER — MORPHINE SULFATE 2 MG/ML IJ SOLN: 2.0000 mg | INTRAMUSCULAR | Status: DC | PRN
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
## 2014-07-27 NOTE — ED Notes (Signed)
Pt taken to CT on monitor with tech and Magnus IvanBlackman.
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
## 2014-07-27 NOTE — ED Notes (Signed)
Pt returned from CT. Vital signs remained stable. No new complaints.

## 2014-07-28 DIAGNOSIS — D62 Acute posthemorrhagic anemia: Secondary | ICD-10-CM | POA: Diagnosis not present

## 2014-07-28 LAB — TYPE AND SCREEN
ABO/RH(D): A POS
ANTIBODY SCREEN: NEGATIVE
UNIT DIVISION: 0
UNIT DIVISION: 0

## 2014-07-28 LAB — CBC
HCT: 24.2 % — ABNORMAL LOW (ref 39.0–52.0)
Hemoglobin: 8 g/dL — ABNORMAL LOW (ref 13.0–17.0)
MCH: 22.2 pg — ABNORMAL LOW (ref 26.0–34.0)
MCHC: 33.1 g/dL (ref 30.0–36.0)
MCV: 67.2 fL — AB (ref 78.0–100.0)
Platelets: 130 10*3/uL — ABNORMAL LOW (ref 150–400)
RBC: 3.6 MIL/uL — AB (ref 4.22–5.81)
RDW: 17.2 % — ABNORMAL HIGH (ref 11.5–15.5)
WBC: 5.9 10*3/uL (ref 4.0–10.5)

## 2014-07-28 MED ORDER — LEVETIRACETAM 500 MG PO TABS
500.0000 mg | ORAL_TABLET | Freq: Two times a day (BID) | ORAL | Status: DC
  Administered 2014-07-28 – 2014-07-29 (×3): 500 mg via ORAL
  Filled 2014-07-28 (×3): qty 1
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
## 2014-07-28 NOTE — Progress Notes (Signed)
UR completed 
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
## 2014-07-29 LAB — CBC
HEMATOCRIT: 24.4 % — AB (ref 39.0–52.0)
HEMOGLOBIN: 8 g/dL — AB (ref 13.0–17.0)
MCH: 22 pg — AB (ref 26.0–34.0)
MCHC: 32.8 g/dL (ref 30.0–36.0)
MCV: 67 fL — AB (ref 78.0–100.0)
Platelets: 143 10*3/uL — ABNORMAL LOW (ref 150–400)
RBC: 3.64 MIL/uL — ABNORMAL LOW (ref 4.22–5.81)
RDW: 17.2 % — ABNORMAL HIGH (ref 11.5–15.5)
WBC: 6.9 10*3/uL (ref 4.0–10.5)

## 2014-07-29 MED ORDER — OXYCODONE-ACETAMINOPHEN 7.5-325 MG PO TABS: 1.0000 | ORAL_TABLET | ORAL | Status: DC | PRN

## 2014-07-29 MED ORDER — HYDROMORPHONE HCL 1 MG/ML IJ SOLN: 0.5000 mg | INTRAMUSCULAR | Status: DC | PRN

## 2014-07-29 MED ORDER — OXYCODONE HCL 5 MG PO TABS
5.0000 mg | ORAL_TABLET | ORAL | Status: DC | PRN
  Administered 2014-07-29: 10 mg via ORAL
  Administered 2014-07-29: 15 mg via ORAL
  Filled 2014-07-29: qty 3
  Filled 2014-07-29: qty 2

## 2014-07-29 NOTE — Discharge Instructions (Signed)
Wash wounds daily in shower with soap and water. °Do not soak. °Apply antibiotic ointment (e.g. Neosporin) twice daily and as needed to keep moist. °Cover with dry dressing. ° °No driving while taking oxycodone. °
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
## 2014-07-29 NOTE — Progress Notes (Signed)
EDCM received call from 6N concerning patient being discharged unable to weight bear needing a rolling walker, patient is, indigent, self pay. States, he cannot afford the payment.   [facility] Germaine, will provide rolling walker pro bono. Patient given walker for discharge. No further CM needs identified

## 2014-07-29 NOTE — Progress Notes (Signed)
UR completed 
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
## 2014-07-29 NOTE — Progress Notes (Signed)
Pt discharged to home.  Discharge instructions explained. Pt had no questions. IV's removed.  Match card given to cover medication.  Walker given and patient chose to walk out on his own.  Shearon StallsJose' D Bozeman, Charity fundraiserN.

## 2014-08-26 ENCOUNTER — Encounter (HOSPITAL_COMMUNITY): Payer: Self-pay | Admitting: Emergency Medicine

## 2014-10-21 ENCOUNTER — Encounter (HOSPITAL_COMMUNITY): Payer: Self-pay | Admitting: Emergency Medicine

## 2014-10-21 ENCOUNTER — Emergency Department (HOSPITAL_COMMUNITY)
Admission: EM | Admit: 2014-10-21 | Discharge: 2014-10-21 | Disposition: A | Discharge status: Home / Self Care | Payer: Medicaid Other | Attending: Emergency Medicine | Admitting: Emergency Medicine

## 2014-10-21 DIAGNOSIS — K920 Hematemesis: Secondary | ICD-10-CM | POA: Insufficient documentation

## 2014-10-21 DIAGNOSIS — R05 Cough: Secondary | ICD-10-CM | POA: Diagnosis not present

## 2014-10-21 DIAGNOSIS — R51 Headache: Secondary | ICD-10-CM | POA: Insufficient documentation

## 2014-10-21 DIAGNOSIS — Z79899 Other long term (current) drug therapy: Secondary | ICD-10-CM | POA: Diagnosis not present

## 2014-10-21 DIAGNOSIS — Z87891 Personal history of nicotine dependence: Secondary | ICD-10-CM | POA: Insufficient documentation

## 2014-10-21 DIAGNOSIS — Z7982 Long term (current) use of aspirin: Secondary | ICD-10-CM | POA: Diagnosis not present

## 2014-10-21 DIAGNOSIS — R5383 Other fatigue: Secondary | ICD-10-CM

## 2014-10-21 DIAGNOSIS — R569 Unspecified convulsions: Secondary | ICD-10-CM

## 2014-10-21 DIAGNOSIS — G40909 Epilepsy, unspecified, not intractable, without status epilepticus: Secondary | ICD-10-CM | POA: Diagnosis present

## 2014-10-21 DIAGNOSIS — R0981 Nasal congestion: Secondary | ICD-10-CM | POA: Diagnosis not present

## 2014-10-21 LAB — BASIC METABOLIC PANEL
ANION GAP: 9 (ref 5–15)
BUN: 12 mg/dL (ref 6–20)
CO2: 22 mmol/L (ref 22–32)
Calcium: 8.8 mg/dL — ABNORMAL LOW (ref 8.9–10.3)
Chloride: 106 mmol/L (ref 101–111)
Creatinine, Ser: 0.85 mg/dL (ref 0.61–1.24)
GFR calc non Af Amer: >60 mL/min (ref >60)
Glucose, Bld: 98 mg/dL (ref 65–99)
Potassium: 3.9 mmol/L (ref 3.5–5.1)
Sodium: 137 mmol/L (ref 135–145)

## 2014-10-21 MED ORDER — SODIUM CHLORIDE 0.9 % IV BOLUS (SEPSIS)
1000.0000 mL | Freq: Once | INTRAVENOUS | Status: AC
  Administered 2014-10-21: 1000 mL via INTRAVENOUS

## 2014-10-21 MED ORDER — SODIUM CHLORIDE 0.9 % IV SOLN
1000.0000 mg | Freq: Once | INTRAVENOUS | Status: AC
  Administered 2014-10-21: 1000 mg via INTRAVENOUS
  Filled 2014-10-21: qty 10

## 2014-10-21 NOTE — ED Notes (Signed)
Per pt, states he does'nt feel good-GF said he woke up this am vomiting blood
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
## 2014-10-21 NOTE — Discharge Instructions (Signed)
If you were given medicines take as directed.  If you are on coumadin or contraceptives realize their levels and effectiveness is altered by many different medicines.  If you have any reaction (rash, tongues swelling, other) to the medicines stop taking and see a physician.    If your blood pressure was elevated in the ER make sure you follow up for management with a primary doctor or return for chest pain, shortness of breath or stroke symptoms.  Please follow up as directed and return to the ER or see a physician for new or worsening symptoms.  Thank you. Filed Vitals:   10/21/14 0845  BP: 118/90  Pulse: 99  Temp: 97.4 F (36.3 C)  TempSrc: Oral  Resp: 18  SpO2: 100%    Epilepsy People with epilepsy have times when they shake and jerk uncontrollably (seizures). This happens when there is a sudden change in brain function. Epilepsy may have many possible causes. Anything that disturbs the normal pattern of brain cell activity can lead to seizures. HOME CARE   Follow your doctor's instructions about driving and safety during normal activities.  Get enough sleep.  Only take medicine as told by your doctor.  Avoid things that you know can cause you to have seizures (triggers).  Write down when your seizures happen and what you remember about each seizure. Write down anything you think may have caused the seizure to happen.  Tell the people you live and work with that you have seizures. Make sure they know how to help you. They should:  Cushion your head and body.  Turn you on your side.  Not restrain you.  Not place anything inside your mouth.  Call for local emergency medical help if there is any question about what has happened.  Keep all follow-up visits with your doctor. This is very important. GET HELP IF:  You get an infection or start to feel sick. You may have more seizures when you are sick.  You are having seizures more often.  Your seizure pattern is  changing. GET HELP RIGHT AWAY IF:   A seizure does not stop after a few seconds or minutes.  A seizure causes you to have trouble breathing.  A seizure gives you a very bad headache.  A seizure makes you unable to speak or use a part of your body. Document Released: 01/16/2009 Document Revised: 01/09/2013 Document Reviewed: 10/31/2012 [facility] Patient Information 2015 [employer] [place]. This information is not intended to replace advice given to you by your health care provider. Make sure you discuss any questions you have with your health care provider.

## 2014-10-21 NOTE — ED Notes (Signed)
Pt states that he is feeling much better.  Ambulated independently to bathroom.

## 2014-10-21 NOTE — ED Notes (Signed)
Patient is alert and orientated x 4.  He is ambulatory and understood discharge instructions.

## 2014-11-15 ENCOUNTER — Encounter (HOSPITAL_COMMUNITY): Payer: Self-pay | Admitting: *Deleted

## 2014-11-15 ENCOUNTER — Emergency Department (HOSPITAL_COMMUNITY)
Admission: EM | Admit: 2014-11-15 | Discharge: 2014-11-15 | Disposition: A | Discharge status: Home / Self Care | Payer: Medicaid Other | Attending: Emergency Medicine | Admitting: Emergency Medicine

## 2014-11-15 DIAGNOSIS — Z87891 Personal history of nicotine dependence: Secondary | ICD-10-CM | POA: Insufficient documentation

## 2014-11-15 DIAGNOSIS — G40909 Epilepsy, unspecified, not intractable, without status epilepticus: Secondary | ICD-10-CM | POA: Diagnosis not present

## 2014-11-15 DIAGNOSIS — Z79899 Other long term (current) drug therapy: Secondary | ICD-10-CM | POA: Insufficient documentation

## 2014-11-15 DIAGNOSIS — Z7982 Long term (current) use of aspirin: Secondary | ICD-10-CM | POA: Diagnosis not present

## 2014-11-15 DIAGNOSIS — R569 Unspecified convulsions: Secondary | ICD-10-CM | POA: Diagnosis present

## 2014-11-15 LAB — BASIC METABOLIC PANEL
ANION GAP: 8 (ref 5–15)
BUN: 6 mg/dL (ref 6–20)
CO2: 27 mmol/L (ref 22–32)
CREATININE: 1.02 mg/dL (ref 0.61–1.24)
Calcium: 9.3 mg/dL (ref 8.9–10.3)
Chloride: 100 mmol/L — ABNORMAL LOW (ref 101–111)
GLUCOSE: 85 mg/dL (ref 65–99)
Potassium: 3.8 mmol/L (ref 3.5–5.1)
Sodium: 135 mmol/L (ref 135–145)

## 2014-11-15 LAB — URINALYSIS, ROUTINE W REFLEX MICROSCOPIC
Bilirubin Urine: NEGATIVE
Glucose, UA: NEGATIVE mg/dL
HGB URINE DIPSTICK: NEGATIVE
KETONES UR: NEGATIVE mg/dL
LEUKOCYTES UA: NEGATIVE
NITRITE: NEGATIVE
Protein, ur: NEGATIVE mg/dL
Specific Gravity, Urine: 1.005 (ref 1.005–1.030)
UROBILINOGEN UA: 0.2 mg/dL (ref 0.0–1.0)
pH: 6 (ref 5.0–8.0)

## 2014-11-15 LAB — CBC
HCT: 33.5 % — ABNORMAL LOW (ref 39.0–52.0)
Hemoglobin: 11 g/dL — ABNORMAL LOW (ref 13.0–17.0)
MCH: 21.6 pg — AB (ref 26.0–34.0)
MCHC: 32.8 g/dL (ref 30.0–36.0)
MCV: 65.8 fL — AB (ref 78.0–100.0)
Platelets: 248 10*3/uL (ref 150–400)
RBC: 5.09 MIL/uL (ref 4.22–5.81)
RDW: 18.4 % — ABNORMAL HIGH (ref 11.5–15.5)
WBC: 8.2 10*3/uL (ref 4.0–10.5)

## 2014-11-15 MED ORDER — SODIUM CHLORIDE 0.9 % IV BOLUS (SEPSIS)
1000.0000 mL | Freq: Once | INTRAVENOUS | Status: AC
  Administered 2014-11-15: 1000 mL via INTRAVENOUS

## 2014-11-15 MED ORDER — LEVETIRACETAM 500 MG/5ML IV SOLN
1000.0000 mg | Freq: Once | INTRAVENOUS | Status: AC
  Administered 2014-11-15: 1000 mg via INTRAVENOUS
  Filled 2014-11-15: qty 10
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
## 2014-11-15 NOTE — ED Notes (Signed)
PT and family reported PT had a seizure prior to arrival  to ED. Pt did not inform triage staff he had a seizure but reported Pt did not feel good.

## 2014-11-15 NOTE — ED Notes (Signed)
Pt moved to A-8  For monitoring and IV  seizure meds

## 2014-11-15 NOTE — ED Provider Notes (Signed)
Patient seen by Kirstie Peri and Dierdre Forth, PA-C. He has had a suspected seizure today.  CBC, CMP and urinalysis are pending Patient has been loaded with Keppra and moved to pod A from [facility] due to acuity. He is doing well and has had no further seizure activity. Plan is that if labs are normal patient is to go home with Rx for Keppra.  He is due to see his neurologist soon.  Patient admits to not taking his medications. I advised him that he is not allowed to drive for 6 months or until cleared by neuro. Pt tells me that he will not stop driving. Patient family member is in the room and does not feel comfortable with him going home after having had a seizure today. She was reassured.  Patient says that he has plenty of Keppra at home but doesn't feel that he needs it. Medications  sodium chloride 0.9 % bolus 1,000 mL (1,000 mLs Intravenous New Bag/Given 11/15/14 1800)  levETIRAcetam (KEPPRA) 1,000 mg in sodium chloride 0.9 % 100 mL IVPB (1,000 mg Intravenous New Bag/Given 11/15/14 1830)    23 y.o.Jesus Bryan's evaluation in the Emergency Department is complete. It has been determined that no acute conditions requiring further emergency intervention are present at this time. The patient/guardian have been advised of the diagnosis and plan. We have discussed signs and symptoms that warrant return to the ED, such as changes or worsening in symptoms.  Vital signs are stable at discharge. Filed Vitals:   11/15/14 1815  BP: 116/69  Pulse: 68  Temp:   Resp: 15    Patient/guardian has voiced understanding and agreed to follow-up with the PCP or specialist.   Marlon Pel, PA-C 11/15/14 1913  [name], MD 11/16/14 1537

## 2014-11-15 NOTE — ED Notes (Signed)
The pt is reporting that he just does not feel well.  He denies pain and repeatedly states that he just does not feel well.  The male with him has requested allergy testing.  Info given    Allergist are seen in their offfice

## 2014-11-15 NOTE — Discharge Instructions (Signed)
° °

## 2014-11-15 NOTE — ED Provider Notes (Signed)
Medical screening examination/treatment/procedure(s) were performed by non-physician practitioner and as supervising physician I was immediately available for consultation/collaboration.   [name], MD 11/15/14 [id number]

## 2015-01-19 ENCOUNTER — Emergency Department (HOSPITAL_COMMUNITY)
Admission: EM | Admit: 2015-01-19 | Discharge: 2015-01-19 | Discharge status: Against Medical Advice | Payer: Medicaid - Out of State | Attending: Emergency Medicine | Admitting: Emergency Medicine

## 2015-01-19 DIAGNOSIS — R569 Unspecified convulsions: Secondary | ICD-10-CM | POA: Diagnosis not present

## 2015-01-19 DIAGNOSIS — R1110 Vomiting, unspecified: Secondary | ICD-10-CM | POA: Diagnosis not present

## 2015-10-29 ENCOUNTER — Emergency Department (HOSPITAL_COMMUNITY): Payer: Medicaid Other

## 2015-10-29 ENCOUNTER — Encounter (HOSPITAL_COMMUNITY): Payer: Self-pay | Admitting: Emergency Medicine

## 2015-10-29 ENCOUNTER — Emergency Department (HOSPITAL_COMMUNITY)
Admission: EM | Admit: 2015-10-29 | Discharge: 2015-10-29 | Disposition: A | Discharge status: Home / Self Care | Payer: Medicaid Other | Attending: Emergency Medicine | Admitting: Emergency Medicine

## 2015-10-29 DIAGNOSIS — G40909 Epilepsy, unspecified, not intractable, without status epilepticus: Secondary | ICD-10-CM | POA: Insufficient documentation

## 2015-10-29 DIAGNOSIS — R569 Unspecified convulsions: Secondary | ICD-10-CM

## 2015-10-29 DIAGNOSIS — Z79899 Other long term (current) drug therapy: Secondary | ICD-10-CM | POA: Diagnosis not present

## 2015-10-29 DIAGNOSIS — Z87891 Personal history of nicotine dependence: Secondary | ICD-10-CM | POA: Insufficient documentation

## 2015-10-29 LAB — COMPREHENSIVE METABOLIC PANEL
ALBUMIN: 3.9 g/dL (ref 3.5–5.0)
ALK PHOS: 52 U/L (ref 38–126)
ALT: 20 U/L (ref 17–63)
AST: 29 U/L (ref 15–41)
Anion gap: 6 (ref 5–15)
BILIRUBIN TOTAL: 0.6 mg/dL (ref 0.3–1.2)
BUN: 8 mg/dL (ref 6–20)
CALCIUM: 9 mg/dL (ref 8.9–10.3)
CO2: 27 mmol/L (ref 22–32)
CREATININE: 1.04 mg/dL (ref 0.61–1.24)
Chloride: 106 mmol/L (ref 101–111)
GFR calc non Af Amer: >60 mL/min (ref >60)
Glucose, Bld: 81 mg/dL (ref 65–99)
Potassium: 3.8 mmol/L (ref 3.5–5.1)
SODIUM: 139 mmol/L (ref 135–145)
TOTAL PROTEIN: 7 g/dL (ref 6.5–8.1)

## 2015-10-29 LAB — CBC WITH DIFFERENTIAL/PLATELET
BASOS ABS: 0 10*3/uL (ref 0.0–0.1)
BASOS PCT: 0 %
EOS ABS: 0 10*3/uL (ref 0.0–0.7)
Eosinophils Relative: 0 %
HEMATOCRIT: 35.9 % — AB (ref 39.0–52.0)
HEMOGLOBIN: 12.1 g/dL — AB (ref 13.0–17.0)
Lymphocytes Relative: 26 %
Lymphs Abs: 1.5 10*3/uL (ref 0.7–4.0)
MCH: 25.2 pg — ABNORMAL LOW (ref 26.0–34.0)
MCHC: 33.7 g/dL (ref 30.0–36.0)
MCV: 74.8 fL — ABNORMAL LOW (ref 78.0–100.0)
MONO ABS: 0.6 10*3/uL (ref 0.1–1.0)
MONOS PCT: 10 %
NEUTROS ABS: 3.6 10*3/uL (ref 1.7–7.7)
NEUTROS PCT: 64 %
Platelets: 209 10*3/uL (ref 150–400)
RBC: 4.8 MIL/uL (ref 4.22–5.81)
RDW: 17.9 % — AB (ref 11.5–15.5)
WBC: 5.7 10*3/uL (ref 4.0–10.5)

## 2015-10-29 LAB — RAPID URINE DRUG SCREEN, HOSP PERFORMED
AMPHETAMINES: NOT DETECTED
BARBITURATES: NOT DETECTED
Benzodiazepines: NOT DETECTED
Cocaine: NOT DETECTED
Opiates: NOT DETECTED
TETRAHYDROCANNABINOL: NOT DETECTED

## 2015-10-29 MED ORDER — LEVETIRACETAM 500 MG PO TABS
500.0000 mg | ORAL_TABLET | Freq: Once | ORAL | Status: AC
  Administered 2015-10-29: 500 mg via ORAL
  Filled 2015-10-29: qty 1

## 2015-10-29 MED ORDER — LEVETIRACETAM 500 MG PO TABS: 1000.0000 mg | ORAL_TABLET | Freq: Two times a day (BID) | ORAL | 0 refills | Status: DC

## 2015-10-29 NOTE — ED Notes (Signed)
Per referral form from jail: "Pronlonged seizure lasting 15 minutes with possible aspiration. Lung sound diminished, O2 started at 84, dropped to 77, and up to 99 with supplemental O2 of 8 LPM. Patient sating at 99 on room air. Hx of seizures, takes 500 mg of Keppra TID"

## 2015-10-29 NOTE — Discharge Instructions (Signed)
You will need to change your dosing of Keppra.  Take according to prescription instructions.

## 2015-10-29 NOTE — ED Notes (Signed)
Patient transported to X-ray 
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
## 2015-10-29 NOTE — ED Notes (Signed)
Pt ambulated in hallway without difficulty

## 2015-10-29 NOTE — ED Notes (Signed)
Bed: [facility] Expected date:  Expected time:  Means of arrival:  Comments: EMS jail-hx seizures-found in cell on floor-? Post-ictal-now alert and oriented

## 2015-10-29 NOTE — ED Triage Notes (Signed)
Patient arrives by EMS from jail.  EMS states patient was found in his cell at 0545 this am on the floor-states "I heard everybody, I just didn't feel good"  Patient has a hx of seizures and is on Keppra.  Patient has been in jail for the past 2 days.

## 2015-12-15 ENCOUNTER — Emergency Department (HOSPITAL_COMMUNITY)
Admission: EM | Admit: 2015-12-15 | Discharge: 2015-12-15 | Disposition: A | Discharge status: Home / Self Care | Payer: Medicaid Other | Attending: Emergency Medicine | Admitting: Emergency Medicine

## 2015-12-15 ENCOUNTER — Encounter (HOSPITAL_COMMUNITY): Payer: Self-pay | Admitting: Emergency Medicine

## 2015-12-15 DIAGNOSIS — Z91010 Allergy to peanuts: Secondary | ICD-10-CM | POA: Diagnosis not present

## 2015-12-15 DIAGNOSIS — R519 Headache, unspecified: Secondary | ICD-10-CM

## 2015-12-15 DIAGNOSIS — Z87891 Personal history of nicotine dependence: Secondary | ICD-10-CM | POA: Diagnosis not present

## 2015-12-15 DIAGNOSIS — R51 Headache: Secondary | ICD-10-CM | POA: Diagnosis not present

## 2015-12-15 MED ORDER — KETOROLAC TROMETHAMINE 30 MG/ML IJ SOLN
30.0000 mg | Freq: Once | INTRAMUSCULAR | Status: AC
  Administered 2015-12-15: 30 mg via INTRAMUSCULAR
  Filled 2015-12-15: qty 1

## 2015-12-15 NOTE — ED Triage Notes (Signed)
Pt arrives with c/o headache onset 4PM yesterday, denies nausea, vomiting, light sensitivity. Neuro check negative. No OTC meds PTA.
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
## 2015-12-17 ENCOUNTER — Encounter (HOSPITAL_COMMUNITY): Payer: Self-pay | Admitting: *Deleted

## 2015-12-17 ENCOUNTER — Emergency Department (HOSPITAL_COMMUNITY)
Admission: EM | Admit: 2015-12-17 | Discharge: 2015-12-17 | Disposition: A | Discharge status: Home / Self Care | Payer: Medicaid Other | Attending: Physician Assistant | Admitting: Physician Assistant

## 2015-12-17 DIAGNOSIS — R000 Tachycardia, unspecified: Secondary | ICD-10-CM | POA: Diagnosis present

## 2015-12-17 DIAGNOSIS — Z87891 Personal history of nicotine dependence: Secondary | ICD-10-CM | POA: Insufficient documentation

## 2015-12-17 DIAGNOSIS — R002 Palpitations: Secondary | ICD-10-CM | POA: Insufficient documentation

## 2015-12-17 DIAGNOSIS — Z91010 Allergy to peanuts: Secondary | ICD-10-CM | POA: Insufficient documentation

## 2015-12-17 LAB — BASIC METABOLIC PANEL
Anion gap: 7 (ref 5–15)
BUN: 8 mg/dL (ref 6–20)
CHLORIDE: 104 mmol/L (ref 101–111)
CO2: 26 mmol/L (ref 22–32)
Calcium: 9.2 mg/dL (ref 8.9–10.3)
Creatinine, Ser: 0.92 mg/dL (ref 0.61–1.24)
GFR calc non Af Amer: >60 mL/min (ref >60)
Glucose, Bld: 85 mg/dL (ref 65–99)
POTASSIUM: 3.6 mmol/L (ref 3.5–5.1)
SODIUM: 137 mmol/L (ref 135–145)

## 2015-12-17 LAB — CBC
HEMATOCRIT: 35.7 % — AB (ref 39.0–52.0)
Hemoglobin: 11.7 g/dL — ABNORMAL LOW (ref 13.0–17.0)
MCH: 25.4 pg — AB (ref 26.0–34.0)
MCHC: 32.8 g/dL (ref 30.0–36.0)
MCV: 77.6 fL — AB (ref 78.0–100.0)
Platelets: 224 10*3/uL (ref 150–400)
RBC: 4.6 MIL/uL (ref 4.22–5.81)
RDW: 16.6 % — ABNORMAL HIGH (ref 11.5–15.5)
WBC: 7.5 10*3/uL (ref 4.0–10.5)

## 2015-12-17 LAB — I-STAT TROPONIN, ED: Troponin i, poc: 0 ng/mL (ref 0.00–0.08)
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
## 2015-12-17 NOTE — ED Notes (Signed)
Pt alert, NAD, calm, interactive, resps e/u, [facility], no dyspnea noted, VSS.

## 2015-12-17 NOTE — ED Triage Notes (Signed)
Pt is here with heart beating fast and having pain behind his neck and feels like his skin is getting pale.  Pt reports right arm keeps hurting him so bad.
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
## 2016-05-26 ENCOUNTER — Emergency Department (HOSPITAL_COMMUNITY): Payer: Medicaid Other

## 2016-05-26 ENCOUNTER — Emergency Department (HOSPITAL_COMMUNITY)
Admission: EM | Admit: 2016-05-26 | Discharge: 2016-05-27 | Disposition: A | Discharge status: Other Acute Inpatient Hospital | Payer: Medicaid Other | Source: Home / Self Care | Attending: Emergency Medicine | Admitting: Emergency Medicine

## 2016-05-26 ENCOUNTER — Emergency Department (HOSPITAL_COMMUNITY): Payer: Medicaid Other | Attending: Emergency Medicine

## 2016-05-26 DIAGNOSIS — Y92410 Unspecified street and highway as the place of occurrence of the external cause: Secondary | ICD-10-CM | POA: Diagnosis not present

## 2016-05-26 DIAGNOSIS — S0121XA Laceration without foreign body of nose, initial encounter: Secondary | ICD-10-CM | POA: Insufficient documentation

## 2016-05-26 DIAGNOSIS — Y9389 Activity, other specified: Secondary | ICD-10-CM | POA: Insufficient documentation

## 2016-05-26 DIAGNOSIS — Z23 Encounter for immunization: Secondary | ICD-10-CM

## 2016-05-26 DIAGNOSIS — R410 Disorientation, unspecified: Secondary | ICD-10-CM | POA: Diagnosis not present

## 2016-05-26 DIAGNOSIS — Z5181 Encounter for therapeutic drug level monitoring: Secondary | ICD-10-CM | POA: Diagnosis not present

## 2016-05-26 DIAGNOSIS — Y999 Unspecified external cause status: Secondary | ICD-10-CM | POA: Insufficient documentation

## 2016-05-26 DIAGNOSIS — S0993XA Unspecified injury of face, initial encounter: Secondary | ICD-10-CM | POA: Diagnosis present

## 2016-05-26 LAB — HEPATIC FUNCTION PANEL
ALBUMIN: 4.2 g/dL (ref 3.5–5.0)
ALT: 22 U/L (ref 17–63)
AST: 28 U/L (ref 15–41)
Alkaline Phosphatase: 52 U/L (ref 38–126)
BILIRUBIN DIRECT: 0.2 mg/dL (ref 0.1–0.5)
Indirect Bilirubin: 0.7 mg/dL (ref 0.3–0.9)
Total Bilirubin: 0.9 mg/dL (ref 0.3–1.2)
Total Protein: 7.2 g/dL (ref 6.5–8.1)

## 2016-05-26 LAB — CBC
HEMATOCRIT: 37 % — AB (ref 39.0–52.0)
HEMOGLOBIN: 12.8 g/dL — AB (ref 13.0–17.0)
MCH: 26.4 pg (ref 26.0–34.0)
MCHC: 34.6 g/dL (ref 30.0–36.0)
MCV: 76.3 fL — ABNORMAL LOW (ref 78.0–100.0)
Platelets: 214 10*3/uL (ref 150–400)
RBC: 4.85 MIL/uL (ref 4.22–5.81)
RDW: 15.9 % — ABNORMAL HIGH (ref 11.5–15.5)
WBC: 4.4 10*3/uL (ref 4.0–10.5)

## 2016-05-26 LAB — PROTIME-INR
INR: 1.21
Prothrombin Time: 15.3 seconds — ABNORMAL HIGH (ref 11.4–15.2)

## 2016-05-26 LAB — I-STAT CHEM 8, ED
BUN: 16 mg/dL (ref 6–20)
CREATININE: 1.2 mg/dL (ref 0.61–1.24)
Calcium, Ion: 1.08 mmol/L — ABNORMAL LOW (ref 1.15–1.40)
Chloride: 103 mmol/L (ref 101–111)
Glucose, Bld: 92 mg/dL (ref 65–99)
HEMATOCRIT: 41 % (ref 39.0–52.0)
Hemoglobin: 13.9 g/dL (ref 13.0–17.0)
POTASSIUM: 3.6 mmol/L (ref 3.5–5.1)
Sodium: 143 mmol/L (ref 135–145)
TCO2: 28 mmol/L (ref 0–100)

## 2016-05-26 LAB — ETHANOL: Alcohol, Ethyl (B): <5 mg/dL (ref <5)

## 2016-05-26 LAB — I-STAT CG4 LACTIC ACID, ED: Lactic Acid, Venous: 1.09 mmol/L (ref 0.5–1.9)

## 2016-05-26 LAB — SAMPLE TO BLOOD BANK

## 2016-05-26 MED ORDER — FENTANYL CITRATE (PF) 100 MCG/2ML IJ SOLN
50.0000 ug | Freq: Once | INTRAMUSCULAR | Status: AC
  Administered 2016-05-26: 50 ug via INTRAVENOUS
  Filled 2016-05-26: qty 2

## 2016-05-26 MED ORDER — TETANUS-DIPHTH-ACELL PERTUSSIS 5-2.5-18.5 LF-MCG/0.5 IM SUSP
0.5000 mL | Freq: Once | INTRAMUSCULAR | Status: AC
  Administered 2016-05-26: 0.5 mL via INTRAMUSCULAR
  Filled 2016-05-26: qty 0.5

## 2016-05-26 NOTE — ED Notes (Signed)
Pt is ambulatory with a steady gait, dermabond at bedside for edp
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
## 2016-05-26 NOTE — ED Notes (Signed)
Per [facility] the patient meets inpt criteria, awaiting placement

## 2016-05-26 NOTE — ED Notes (Signed)
tts at bedside 

## 2016-05-26 NOTE — ED Notes (Signed)
Pt continues to stand at the door and ask this nurse to come here, when I ask the patient what I can do for him he winks at me, instructed to the patient that that is not appropriate and politely asked him to not do that again

## 2016-05-26 NOTE — Progress Notes (Signed)
Orthopedic Tech Progress Note Patient Details:  Jesus Bryan Yyy Doe 04/04/1877 [id number]  Ortho Devices Type of Ortho Device: Other (comment) Ortho Device/Splint Location: Pt does not need Ortho Tech Care at this time.  (Ortho tech)   Alvina ChouWilliams, [name] 05/26/2016, 4:33 PM
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
## 2016-05-26 NOTE — ED Notes (Signed)
Pt requests to not wear monitor, per edp it is okay

## 2016-05-26 NOTE — Discharge Instructions (Signed)
Do not drive or operate heavy machinery until cleared by your doctor °

## 2016-05-26 NOTE — ED Notes (Signed)
Pt placed up for discharge, the patient is intermittently confused and staff does not feel safe discharging him despise normal findings on scans, we cannot get in touch with any family to prove this is his baseline so the pt will be a 24 hour hold.  The patient is giggling often when asked questions and keeps saying that it is 2015 and that he will be fine as soon as he gets back to his car, when instructed the patient that it is not safe for him to drive he does not seem to understand

## 2016-05-27 ENCOUNTER — Encounter (HOSPITAL_COMMUNITY): Payer: Self-pay | Admitting: General Practice

## 2016-05-27 ENCOUNTER — Inpatient Hospital Stay (HOSPITAL_COMMUNITY)
Admission: AD | Admit: 2016-05-27 | Discharge: 2016-06-15 | DRG: 885 | Disposition: A | Discharge status: Home / Self Care | Payer: Medicaid Other | Attending: Psychiatry | Admitting: Psychiatry

## 2016-05-27 DIAGNOSIS — G9340 Encephalopathy, unspecified: Secondary | ICD-10-CM | POA: Diagnosis present

## 2016-05-27 DIAGNOSIS — F17210 Nicotine dependence, cigarettes, uncomplicated: Secondary | ICD-10-CM | POA: Diagnosis present

## 2016-05-27 DIAGNOSIS — F22 Delusional disorders: Secondary | ICD-10-CM

## 2016-05-27 DIAGNOSIS — S0121XA Laceration without foreign body of nose, initial encounter: Secondary | ICD-10-CM | POA: Diagnosis not present

## 2016-05-27 DIAGNOSIS — Z91010 Allergy to peanuts: Secondary | ICD-10-CM

## 2016-05-27 DIAGNOSIS — Z9119 Patient's noncompliance with other medical treatment and regimen: Secondary | ICD-10-CM

## 2016-05-27 DIAGNOSIS — G40909 Epilepsy, unspecified, not intractable, without status epilepticus: Secondary | ICD-10-CM | POA: Diagnosis present

## 2016-05-27 DIAGNOSIS — F419 Anxiety disorder, unspecified: Secondary | ICD-10-CM | POA: Diagnosis not present

## 2016-05-27 DIAGNOSIS — R569 Unspecified convulsions: Secondary | ICD-10-CM

## 2016-05-27 DIAGNOSIS — Z79899 Other long term (current) drug therapy: Secondary | ICD-10-CM

## 2016-05-27 DIAGNOSIS — F259 Schizoaffective disorder, unspecified: Secondary | ICD-10-CM | POA: Diagnosis present

## 2016-05-27 DIAGNOSIS — F250 Schizoaffective disorder, bipolar type: Secondary | ICD-10-CM | POA: Diagnosis present

## 2016-05-27 DIAGNOSIS — G4700 Insomnia, unspecified: Secondary | ICD-10-CM | POA: Diagnosis not present

## 2016-05-27 LAB — RAPID URINE DRUG SCREEN, HOSP PERFORMED
Amphetamines: NOT DETECTED
BARBITURATES: NOT DETECTED
BENZODIAZEPINES: NOT DETECTED
COCAINE: NOT DETECTED
OPIATES: NOT DETECTED
TETRAHYDROCANNABINOL: NOT DETECTED

## 2016-05-27 LAB — URINALYSIS, ROUTINE W REFLEX MICROSCOPIC
BILIRUBIN URINE: NEGATIVE
GLUCOSE, UA: NEGATIVE mg/dL
HGB URINE DIPSTICK: NEGATIVE
Ketones, ur: NEGATIVE mg/dL
Leukocytes, UA: NEGATIVE
Nitrite: NEGATIVE
PROTEIN: NEGATIVE mg/dL
SPECIFIC GRAVITY, URINE: 1.005 (ref 1.005–1.030)
pH: 6 (ref 5.0–8.0)

## 2016-05-27 MED ORDER — TRAZODONE HCL 50 MG PO TABS
50.0000 mg | ORAL_TABLET | Freq: Every evening | ORAL | Status: DC | PRN
  Administered 2016-05-28 – 2016-06-01 (×5): 50 mg via ORAL
  Filled 2016-05-27 (×20): qty 1

## 2016-05-27 MED ORDER — TRAZODONE HCL 50 MG PO TABS
ORAL_TABLET | ORAL | Status: AC
  Filled 2016-05-27: qty 1

## 2016-05-27 MED ORDER — LEVETIRACETAM 500 MG PO TABS
500.0000 mg | ORAL_TABLET | Freq: Three times a day (TID) | ORAL | Status: DC
  Administered 2016-05-27: 500 mg via ORAL
  Filled 2016-05-27: qty 1

## 2016-05-27 MED ORDER — LORAZEPAM 1 MG PO TABS: 1.0000 mg | ORAL_TABLET | Freq: Two times a day (BID) | ORAL | Status: DC

## 2016-05-27 MED ORDER — ENSURE ENLIVE PO LIQD
237.0000 mL | Freq: Two times a day (BID) | ORAL | Status: DC
  Administered 2016-05-28 – 2016-06-14 (×21): 237 mL via ORAL
  Filled 2016-05-27 (×4): qty 237

## 2016-05-27 MED ORDER — MAGNESIUM HYDROXIDE 400 MG/5ML PO SUSP: 30.0000 mL | Freq: Every day | ORAL | Status: DC | PRN

## 2016-05-27 MED ORDER — LEVETIRACETAM 500 MG PO TABS
500.0000 mg | ORAL_TABLET | Freq: Three times a day (TID) | ORAL | Status: DC
  Administered 2016-05-27 – 2016-06-02 (×16): 500 mg via ORAL
  Filled 2016-05-27 (×29): qty 1

## 2016-05-27 MED ORDER — ALUM & MAG HYDROXIDE-SIMETH 200-200-20 MG/5ML PO SUSP: 30.0000 mL | ORAL | Status: DC | PRN

## 2016-05-27 MED ORDER — ACETAMINOPHEN 325 MG PO TABS
650.0000 mg | ORAL_TABLET | Freq: Four times a day (QID) | ORAL | Status: DC | PRN
  Administered 2016-06-10 – 2016-06-14 (×2): 650 mg via ORAL
  Filled 2016-05-27 (×2): qty 2

## 2016-05-27 MED ORDER — OLANZAPINE 5 MG PO TABS: 20.0000 mg | ORAL_TABLET | Freq: Every day | ORAL | Status: DC

## 2016-05-27 MED ORDER — DIVALPROEX SODIUM 250 MG PO DR TAB: 750.0000 mg | DELAYED_RELEASE_TABLET | Freq: Every day | ORAL | Status: DC

## 2016-05-27 MED ORDER — LEVETIRACETAM 500 MG PO TABS: 500.0000 mg | ORAL_TABLET | Freq: Three times a day (TID) | ORAL | Status: DC

## 2016-05-27 NOTE — ED Notes (Signed)
RN contacted pharmacy to reschedule meds

## 2016-05-27 NOTE — Progress Notes (Signed)
Patient has been isolative to his room lying in bed resting. He was informed of medication schedule but he refused. He requested water and held his cup up and nodded at Clinical research associatewriter and did the same for mht on hallway. His behavior was odd and was not willing to talk with Clinical research associatewriter. Safety maintained on unit with 15 min checks.

## 2016-05-27 NOTE — ED Notes (Signed)
Pt woken to take seizure medication.  RN explained again that medications are not locked up w/ his belongings in his room but rather in the pharmacy.  He refused to take the medication now stating that he takes it at 7am.  RN will give medication at 7am.

## 2016-05-27 NOTE — ED Notes (Signed)
RN and Tec inventoried belongings, placing valuables w/ security and counting mediations.  RN Runner, broadcasting/film/videonotified Charge and pharmacy of medications including Keppra.  Pharmacy here to complete med history using medications found.

## 2016-05-27 NOTE — Progress Notes (Signed)
[facility] attending Dr. Elna BreslowEappen. Report # [id number]. Notified [facility]. Pt voluntary at this time. [facility]- if pt oriented to self/situation and willing to sign admission consent, can accept voluntarily. Otherwise recommend pt be under IVC prior to transfer to ensure safe admission.   Ilean SkillMeghan Conchita Truxillo, MSW, LCSW Clinical Social Work, Disposition  05/27/2016 [phone]

## 2016-05-27 NOTE — Progress Notes (Signed)
Did not attend group 
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
## 2016-05-27 NOTE — Discharge Planning (Signed)
Pt up for discharge. EDCM reviewed chart for possible CM needs.  No needs identified or communicated.  
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
## 2016-05-28 ENCOUNTER — Encounter (HOSPITAL_COMMUNITY): Payer: Self-pay | Admitting: Registered Nurse

## 2016-05-28 DIAGNOSIS — Z79899 Other long term (current) drug therapy: Secondary | ICD-10-CM

## 2016-05-28 DIAGNOSIS — F22 Delusional disorders: Secondary | ICD-10-CM

## 2016-05-28 DIAGNOSIS — F17210 Nicotine dependence, cigarettes, uncomplicated: Secondary | ICD-10-CM

## 2016-05-28 DIAGNOSIS — F259 Schizoaffective disorder, unspecified: Secondary | ICD-10-CM

## 2016-05-28 MED ORDER — OLANZAPINE 5 MG PO TABS
5.0000 mg | ORAL_TABLET | Freq: Every day | ORAL | Status: DC
  Administered 2016-05-29: 5 mg via ORAL
  Filled 2016-05-28: qty 2
  Filled 2016-05-28 (×3): qty 1

## 2016-05-28 NOTE — [facility] Group Notes (Signed)
[facility] Group Notes:  (Clinical Social Work)  05/28/2016  11:15-12:00PM  Summary of Progress/Problems:   Today's process group involved patients discussing their feelings related to being hospitalized, as well as benefits they see to being in the hospital.  These were itemized on the whiteboard, and then the group brainstormed specific ways in which they could seek for those same benefits to happen when they discharge and go back home. The patient expressed a primary feeling about being hospitalized is "good, everyone is cooperating, and there is love and respect."  He asked to be called "Jesus" and said to stay out of the hospital he will "show love."  Type of Therapy:  Group Therapy - Process  Participation Level:  Active  Participation Quality:  Attentive  Affect:  Appropriate  Cognitive:  Delusional  Insight:  Poor  Engagement in Therapy:  Developing/Improving  Modes of Intervention:  Exploration, Discussion  Ambrose MantleMareida Grossman-Orr, LCSW 05/28/2016, 1:04 PM

## 2016-05-28 NOTE — Progress Notes (Signed)
D: Patient's self inventory sheet: patient has poor sleep, requested sleep medication (Pt actually refused HS Trazadone, no sleep medication given).fair  Appetite, high energy level, good concentration. Rated depression BLANK/10, hopeless BLANK/10, anxiety BLANK/10. SI/HI/AVH: Endorses SI, stating "sometimes" Denied verbally. Physical complaints are denied. Goal is "working". Plans to work on (blank). Patient with minimal verbalization, affect and facial expression incongruent with thought content.    A: Medications administered, assessed medication knowledge and education given on medication regimen.  Emotional support and encouragement given patient. R: Reports SI on sheet but denies verbally, denies HI , contracts for safety. Safety maintained with 15 minute checks.
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
## 2016-05-28 NOTE — [facility] Counselor (Signed)
Clinical Social Work Note  CSW attempted to do Psychosocial Assessment with patient, who remained disorganized and delusional and unable to answer questions in a meaningful manner.  He did sign consent for us to speak with the person listed in the chart as his significant other, Harriet Pholexis Nickleson [phone].  CSW attempted to call that number, connected with a voicemail and did leave a message with a call-back number asking for collateral information.  Ambrose MantleMareida Grossman-Orr, LCSW 05/28/2016, 4:54 PM
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
## 2016-05-29 NOTE — Progress Notes (Signed)
Adult Psychoeducational Group Note  Date:  05/29/2016 Time:  8:45 PM  Group Topic/Focus:  Wrap-Up Group:   The focus of this group is to help patients review their daily goal of treatment and discuss progress on daily workbooks.  Participation Level:  Active  Participation Quality:  Appropriate  Affect:  Appropriate  Cognitive:  Alert  Insight: Appropriate  Engagement in Group:  Engaged  Modes of Intervention:  Discussion  Additional Comments:  Patient stated he had an alright day.   Labib Cwynar L Karlye Ihrig 05/29/2016, 8:45 PM

## 2016-05-29 NOTE — [facility] Group Notes (Signed)
[facility] Group Notes:  (Clinical Social Work)  05/29/2016  11:00AM-12:00PM  Summary of Progress/Problems:  The main focus of today's process group was to listen to a variety of genres of music and to identify that different types of music provoke different responses.  The patient then was able to identify personally what was soothing for them, as well as energizing, as well as how patient can personally use this knowledge in sleep habits, with depression, and with other symptoms.  The patient expressed at the beginning of group the overall feeling of "love" and he smiled and watched people singing and dancing, did not participate.  He seems to enjoy all the different types of music.  Type of Therapy:  Music Therapy   Participation Level:  Active  Participation Quality:  Attentive  Affect:  Not congruent  Cognitive:  Delusional  Insight:  Limited  Engagement in Therapy:  Engaged  Modes of Intervention:   Activity, Exploration  Ambrose MantleMareida Grossman-Orr, LCSW 05/29/2016
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
## 2016-05-29 NOTE — Progress Notes (Signed)
D: Patient's self inventory sheet: patient has good sleep, (refused) sleep medication.good  Appetite, normal high energy level, good concentration. Rated depression no response/10, hopeless no response/10, anxiety no response/10. SI/HI/AVH: no response on self inventory but verbally denies all. Physical complaints are dizziness. Goal is no response. Plans to work on no response.   A: Medications administered, assessed medication knowledge and education given on medication regimen.  Emotional support and encouragement given patient. R: Denies SI and HI , contracts for safety. Safety maintained with 15 minute checks.

## 2016-05-29 NOTE — Progress Notes (Signed)
Writer went to patients room to speak with him after his room mate came c/o him walking around in the room while he was trying to sleep and sitting on his bed watching him which made him very uncomfortable and reported that he could not stay in the room with him. Writer encouraged him to come and take his medication. During the time writer was talking to him he continued to laugh out loud and smiling at me. Writer observed him smiling at another male patient walking towards her and she motioned for him to go away.  Writer asked him if he knew her and he just laughed. He took trazadone 50 mg but did not want the zyprexa. Writer observed him walking back to his room but stopped at the door where his room mate moved to and was going to knock on the door but Clinical research associatewriter redirected him back to his room. Safety maintained on unit with 15 min checks. in the hall and

## 2016-05-29 NOTE — Progress Notes (Signed)
Pt has been observed most of the evening in the dayroom watching TV and talking with peers.  He tends to be flirty with the younger male peers and staff members.  Pt reports he had a good day.  Conversation with pt was difficult as his voice is soft and he tends to [facility] when he is talking.  Pt was tangential in conversation also.  He did deny SI/HI/AVH when asked, although he appeared to be responding to internal stimuli.  Pt was pleasant and did take his hs meds after writer offered them and explained what they were.  Support and encouragement offered.  Discharge plans are in process.  Safety maintained with q15 minute checks.

## 2016-05-30 DIAGNOSIS — R569 Unspecified convulsions: Secondary | ICD-10-CM

## 2016-05-30 MED ORDER — HALOPERIDOL 5 MG PO TABS
5.0000 mg | ORAL_TABLET | Freq: Three times a day (TID) | ORAL | Status: DC | PRN
  Administered 2016-06-10: 5 mg via ORAL
  Filled 2016-05-30: qty 1

## 2016-05-30 MED ORDER — HALOPERIDOL LACTATE 5 MG/ML IJ SOLN: 5.0000 mg | Freq: Three times a day (TID) | INTRAMUSCULAR | Status: DC | PRN

## 2016-05-30 MED ORDER — LORAZEPAM 1 MG PO TABS
1.0000 mg | ORAL_TABLET | Freq: Four times a day (QID) | ORAL | Status: DC | PRN
  Administered 2016-05-31 – 2016-06-14 (×12): 1 mg via ORAL
  Filled 2016-05-30 (×13): qty 1

## 2016-05-30 MED ORDER — LORAZEPAM 2 MG/ML IJ SOLN
1.0000 mg | Freq: Four times a day (QID) | INTRAMUSCULAR | Status: DC | PRN
  Administered 2016-06-02: 1 mg via INTRAMUSCULAR
  Filled 2016-05-30 (×3): qty 1

## 2016-05-30 MED ORDER — DIPHENHYDRAMINE HCL 25 MG PO CAPS
25.0000 mg | ORAL_CAPSULE | Freq: Three times a day (TID) | ORAL | Status: DC | PRN
  Administered 2016-06-10: 25 mg via ORAL
  Filled 2016-05-30: qty 1

## 2016-05-30 MED ORDER — DIPHENHYDRAMINE HCL 50 MG/ML IJ SOLN: 25.0000 mg | Freq: Four times a day (QID) | INTRAMUSCULAR | Status: DC | PRN

## 2016-05-30 MED ORDER — OLANZAPINE 10 MG PO TABS
10.0000 mg | ORAL_TABLET | Freq: Every day | ORAL | Status: DC
  Administered 2016-05-30 – 2016-05-31 (×2): 10 mg via ORAL
  Filled 2016-05-30 (×4): qty 1

## 2016-05-30 NOTE — Tx Team (Signed)
Interdisciplinary Treatment and Diagnostic Plan Update  05/30/2016 Time of Session: 8:27 AM  Jesus Bryan MRN: [id number]  Principal Diagnosis: Schizoaffective disorder [facility])  Secondary Diagnoses: Principal Problem:   Schizoaffective disorder ([place]) Active Problems:   Delusions ([place])   Current Medications:  Current Facility-Administered Medications  Medication Dose Route Frequency Provider Last Rate Last Dose  . acetaminophen (TYLENOL) tablet 650 mg  650 mg Oral Q6H PRN Derrill Center, NP      . alum & mag hydroxide-simeth (MAALOX/MYLANTA) 200-200-20 MG/5ML suspension 30 mL  30 mL Oral Q4H PRN Derrill Center, NP      . feeding supplement (ENSURE ENLIVE) (ENSURE ENLIVE) liquid 237 mL  237 mL Oral BID BM Saramma Eappen, MD   237 mL at 05/28/16 1648  . levETIRAcetam (KEPPRA) tablet 500 mg  500 mg Oral TID Derrill Center, NP   500 mg at 05/30/16 0752  . magnesium hydroxide (MILK OF MAGNESIA) suspension 30 mL  30 mL Oral Daily PRN Derrill Center, NP      . OLANZapine (ZYPREXA) tablet 5 mg  5 mg Oral QHS Jenne Campus, MD   5 mg at 05/29/16 2102  . traZODone (DESYREL) tablet 50 mg  50 mg Oral QHS,MR X 1 Derrill Center, NP   50 mg at 05/29/16 2102    PTA Medications: Prescriptions Prior to Admission  Medication Sig Dispense Refill Last Dose  . divalproex (DEPAKOTE) 500 MG DR tablet Take 750 mg by mouth at bedtime.     . ferrous sulfate 325 (65 FE) MG tablet Take 325 mg by mouth daily with breakfast.     . levETIRAcetam (KEPPRA) 500 MG tablet Take 500 mg by mouth 3 (three) times daily.     Marland Kitchen LORazepam (ATIVAN) 1 MG tablet Take 1 mg by mouth 2 (two) times daily.     Marland Kitchen OLANZapine (ZYPREXA) 20 MG tablet Take 20 mg by mouth at bedtime.       Treatment Modalities: Medication Management, Group therapy, Case management,  1 to 1 session with clinician, Psychoeducation, Recreational therapy.   Physician Treatment Plan for Primary Diagnosis: Schizoaffective disorder ([place]) Long Term Goal(s):  Improvement in symptoms so as ready for discharge  Short Term Goals: Ability to verbalize feelings will improve Ability to disclose and discuss suicidal ideas Ability to demonstrate self-control will improve Compliance with prescribed medications will improve Ability to identify changes in lifestyle to reduce recurrence of condition will improve Ability to maintain clinical measurements within normal limits will improve Compliance with prescribed medications will improve Ability to identify triggers associated with substance abuse/mental health issues will improve  Medication Management: Evaluate patient's response, side effects, and tolerance of medication regimen.  Therapeutic Interventions: 1 to 1 sessions, Unit Group sessions and Medication administration.  Evaluation of Outcomes: Progressing  Physician Treatment Plan for Secondary Diagnosis: Principal Problem:   Schizoaffective disorder ([place]) Active Problems:   Delusions ([place])   Long Term Goal(s): Improvement in symptoms so as ready for discharge  Short Term Goals: Ability to verbalize feelings will improve Ability to disclose and discuss suicidal ideas Ability to demonstrate self-control will improve Compliance with prescribed medications will improve Ability to identify changes in lifestyle to reduce recurrence of condition will improve Ability to maintain clinical measurements within normal limits will improve Compliance with prescribed medications will improve Ability to identify triggers associated with substance abuse/mental health issues will improve  Medication Management: Evaluate patient's response, side effects, and tolerance of medication regimen.  Therapeutic Interventions:  1 to 1 sessions, Unit Group sessions and Medication administration.  Evaluation of Outcomes: Progressing   RN Treatment Plan for Primary Diagnosis: Schizoaffective disorder ([place]) Long Term Goal(s): Knowledge of disease and therapeutic  regimen to maintain health will improve  Short Term Goals: Ability to identify and develop effective coping behaviors will improve and Compliance with prescribed medications will improve  Medication Management: RN will administer medications as ordered by provider, will assess and evaluate patient's response and provide education to patient for prescribed medication. RN will report any adverse and/or side effects to prescribing provider.  Therapeutic Interventions: 1 on 1 counseling sessions, Psychoeducation, Medication administration, Evaluate responses to treatment, Monitor vital signs and CBGs as ordered, Perform/monitor CIWA, COWS, AIMS and Fall Risk screenings as ordered, Perform wound care treatments as ordered.  Evaluation of Outcomes: Progressing   LCSW Treatment Plan for Primary Diagnosis: Schizoaffective disorder ([place]) Long Term Goal(s): Safe transition to appropriate next level of care at discharge, Engage patient in therapeutic group addressing interpersonal concerns.  Short Term Goals: Engage patient in aftercare planning with referrals and resources  Therapeutic Interventions: Assess for all discharge needs, 1 to 1 time with Social worker, Explore available resources and support systems, Assess for adequacy in community support network, Educate family and significant other(s) on suicide prevention, Complete Psychosocial Assessment, Interpersonal group therapy.  Evaluation of Outcomes: Met  Return home, follow up outpt   Progress in Treatment: Attending groups: Yes Participating in groups: Minimally Taking medication as prescribed: Yes Toleration medication: Yes, no side effects reported at this time Family/Significant other contact made: No  Left message for SO at number listed in chart Patient understands diagnosis: No  Limited insight Discussing patient identified problems/goals with staff: Yes Medical problems stabilized or resolved: Yes Denies suicidal/homicidal  ideation: Yes Issues/concerns per patient self-inventory: None Other: N/A  New problem(s) identified: Pt refusing to sign release for [facility] medical records   New Short Term/Long Term Goal(s): None identified at this time.   Discharge Plan or Barriers:   Reason for Continuation of Hospitalization: Disorganization Paranoia Medication stabilization   Estimated Length of Stay: 3-5 days  Attendees: Patient: 05/30/2016  8:27 AM  Physician: Ursula Alert, MD 05/30/2016  8:27 AM  Nursing: Hoy Register, RN 05/30/2016  8:27 AM  RN Care Manager: Lars Pinks, RN 05/30/2016  8:27 AM  Social Worker: Ripley Fraise 05/30/2016  8:27 AM  Recreational Therapist: Laretta Bolster  05/30/2016  8:27 AM  Other: Norberto Sorenson 05/30/2016  8:27 AM  Other:  05/30/2016  8:27 AM    Scribe for Treatment Team:  Roque Lias LCSW 05/30/2016 8:27 AM
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
## 2016-05-30 NOTE — Progress Notes (Signed)
DAR NOTE: Patient presents with flat affect and depressed mood.  Denies pain, auditory and visual hallucinations.  Rates depression at 0, hopelessness at 0, and anxiety at 0.  Maintained on routine safety checks.  Medications given as prescribed.  Support and encouragement offered as needed.  Attended group and participated.  Patient was visible in the dayroom.  Minimal interaction with staff and peers.  Offered no complaint.

## 2016-05-30 NOTE — Progress Notes (Signed)
Recreation Therapy Notes  Date: 05/30/16 Time: 1000 Location: 500 Hall Dayroom  Group Topic: Anger Management  Goal Area(s) Addresses:  Patient will identify triggers for anger.  Patient will identify physical reaction to anger.   Patient will identify benefit of using coping skills when angry.  Behavioral Response:  None  Intervention: Anger umbrella worksheet, pencils  Activity:  Anger Umbrella.  Patients were given a worksheet with an umbrella on it.  Patients were to fill in the umbrella with the emotions that cause anger or situations that cause them to get angry.  Once patients identified the emotions and situations that cause anger, they were to identify coping skills and write them along the outside of the umbrella.    Education: Anger Management, Discharge Planning   Education Outcome: Acknowledges education/In group clarification offered/Needs additional education.   Clinical Observations/Feedback: Pt arrived late, left early with doctor and did not return.   Jesus RancherMarjette Mahonri Seiden, LRT/CTRS      Jesus RancherLindsay, Telesa Jeancharles A 05/30/2016 1:06 PM

## 2016-05-30 NOTE — Progress Notes (Signed)
Did not attend group 

## 2016-05-30 NOTE — Progress Notes (Signed)
21/24/18-05/29/06 I was inform of a safety sheet missing.Me and Jesus Bryan conduct safety checks on Jesus Bryan.Jesus Bryan remained safe on the unit.

## 2016-05-30 NOTE — Progress Notes (Addendum)
[facility] MD Progress Note  05/30/2016 11:49 AM Jesus Bryan  MRN:  [id number] Subjective:  Patient states " I am Jesus christ , I am God .'  Objective: Patient seen and chart reviewed.Discussed patient with treatment team.  Pt seen in room ,is delusional, grandiose , disorganized, is a very limited historian. Pt has thought blocking , has poor insight about his illness. Pt has been taking medications , continue to monitor.  Principal Problem: Schizoaffective disorder (HCC) Diagnosis:   Patient Active Problem List   Diagnosis Date Noted  . Seizures (HCC) [R56.9] 05/30/2016  . Delusions (HCC) [F22]   . Schizoaffective disorder (HCC) [F25.9] 05/27/2016   Total Time spent with patient: 25 minutes  Past Psychiatric History: pt was admitted at [facility] in the past - pt however not a good historian and is not willing to sign a consent for release of information from [facility].  Past Medical History:  Past Medical History:  Diagnosis Date  . Seizures (HCC)     Past Surgical History:  Procedure Laterality Date  . NO PAST SURGERIES     Family History: is disorganized , unable to comment , will need to get collateral information Family Psychiatric  History: same as above Social History:  History  Alcohol Use  . 4.2 oz/week  . 7 Glasses of wine per week    Comment: poor historian     History  Drug Use No    Social History   Social History  . Marital status: Single    Spouse name: N/A  . Number of children: N/A  . Years of education: N/A   Social History Main Topics  . Smoking status: Current Every Day Smoker    Packs/day: 1.00    Years: 5.00  . Smokeless tobacco: Never Used     Comment: poor historian, could not give length of smoking history  . Alcohol use 4.2 oz/week    7 Glasses of wine per week     Comment: poor historian  . Drug use: No  . Sexual activity: No   Other Topics Concern  . None   Social History Narrative  . None   Additional Social History:                          Sleep: restless  Appetite:  Fair  Current Medications: Current Facility-Administered Medications  Medication Dose Route Frequency Provider Last Rate Last Dose  . acetaminophen (TYLENOL) tablet 650 mg  650 mg Oral Q6H PRN Oneta Rack, NP      . alum & mag hydroxide-simeth (MAALOX/MYLANTA) 200-200-20 MG/5ML suspension 30 mL  30 mL Oral Q4H PRN Oneta Rack, NP      . diphenhydrAMINE (BENADRYL) capsule 25 mg  25 mg Oral Q8H PRN Jomarie Longs, MD       Or  . diphenhydrAMINE (BENADRYL) injection 25 mg  25 mg Intramuscular Q6H PRN Jaymen Fetch, MD      . feeding supplement (ENSURE ENLIVE) (ENSURE ENLIVE) liquid 237 mL  237 mL Oral BID BM Iysha Mishkin, MD   237 mL at 05/30/16 1118  . haloperidol (HALDOL) tablet 5 mg  5 mg Oral Q8H PRN Obaloluwa Delatte, MD       Or  . haloperidol lactate (HALDOL) injection 5 mg  5 mg Intramuscular Q8H PRN Emaleigh Guimond, MD      . levETIRAcetam (KEPPRA) tablet 500 mg  500 mg Oral TID Oneta Rack, NP   500  mg at 05/30/16 1119  . LORazepam (ATIVAN) tablet 1 mg  1 mg Oral Q6H PRN Jomarie Longs, MD       Or  . LORazepam (ATIVAN) injection 1 mg  1 mg Intramuscular Q6H PRN Delorse Shane, MD      . magnesium hydroxide (MILK OF MAGNESIA) suspension 30 mL  30 mL Oral Daily PRN Oneta Rack, NP      . OLANZapine (ZYPREXA) tablet 10 mg  10 mg Oral QHS Kaliope Quinonez, MD      . traZODone (DESYREL) tablet 50 mg  50 mg Oral QHS,MR X 1 Oneta Rack, NP   50 mg at 05/29/16 2102    Lab Results: No results found for this or any previous visit (from the past 48 hour(s)).  Blood Alcohol level:  Lab Results  Component Value Date   ETH <5 05/26/2016    Metabolic Disorder Labs: No results found for: HGBA1C, MPG No results found for: PROLACTIN No results found for: CHOL, TRIG, HDL, CHOLHDL, VLDL, LDLCALC  Physical Findings: AIMS: Facial and Oral Movements Muscles of Facial Expression: None, normal Lips and Perioral Area: None,  normal Jaw: None, normal Tongue: None, normal,Extremity Movements Upper (arms, wrists, hands, fingers): None, normal Lower (legs, knees, ankles, toes): None, normal, Trunk Movements Neck, shoulders, hips: None, normal, Overall Severity Severity of abnormal movements (highest score from questions above): None, normal Incapacitation due to abnormal movements: None, normal Patient's awareness of abnormal movements (rate only patient's report): No Awareness, Dental Status Current problems with teeth and/or dentures?: No Does patient usually wear dentures?: No  CIWA:    COWS:     Musculoskeletal: Strength & Muscle Tone: within normal limits Gait & Station: normal Patient leans: N/A  Psychiatric Specialty Exam: Physical Exam  Review of Systems  Psychiatric/Behavioral: The patient is nervous/anxious.   All other systems reviewed and are negative.   Blood pressure 113/83, pulse 81, temperature 97.6 F (36.4 C), temperature source Oral, resp. rate 16, height 5\' 6"  (1.676 m), weight 53.5 kg (118 lb), SpO2 100 %.Body mass index is 19.05 kg/m.  General Appearance: Guarded  Eye Contact:  Fair  Speech:  Blocked  Volume:  Normal  Mood:  Anxious and Dysphoric  Affect:  Congruent  Thought Process:  Disorganized, Irrelevant and Descriptions of Associations: Intact  Orientation:  Full (Time, Place, and Person)  Thought Content:  Delusions  Suicidal Thoughts:  No  Homicidal Thoughts:  No  Memory:  Immediate;   Fair Recent;   Poor Remote;   Poor  Judgement:  Impaired  Insight:  Shallow  Psychomotor Activity:  Normal  Concentration:  Concentration: Fair and Attention Span: Fair  Recall:  [employer] of Knowledge:  Fair  Language:  Fair  Akathisia:  No  Handed:  Right  AIMS (if indicated):     Assets:  Desire for Improvement  ADL's:  Intact  Cognition:  WNL  Sleep:  Number of Hours: 6.5   Schizoaffective disorder (HCC) unstable   Treatment Plan Summary:Patient today seen as  delusional, has thought blocking and is disorganized , calls himself God and Jesus christ , is a limited historian , does have hx of being admitted to [facility] in the past, CSW to work on obtaining records. Daily contact with patient to assess and evaluate symptoms and progress in treatment, Medication management and Plan see below  For schizoaffective do: Increase Zyprexa to 10 mg po qhs.  For insomnia: Trazodone 50 mg po qhs prn.  For anxiety/agitation:  PRN medications as per unit protocol.  For seizure do ; Keppra 500 mg po tid.  CT scan brain - reviewed - no acute abnormalities.  Will get tsh, lipid panel, hba1c, pl.  CSW will continue to work on obtaining medical records and collateral information.  Lycan Davee, MD 05/30/2016, 11:49 AM

## 2016-05-30 NOTE — B[facility] Counselor (Signed)
Pt unwilling to engage in PSA process.  Left message for SO at new number I found- [phone].

## 2016-05-30 NOTE — Social Work (Signed)
Referred to [facility], is [place] resident.  Anne Cunningham, LCSW Lead Clinical Social Worker Phone:  [phone]  

## 2016-05-30 NOTE — [facility] Group Notes (Signed)
[facility] LCSW Group Therapy  05/30/2016 1:15 pm  Type of Therapy: Process Group Therapy  Participation Level:  Active  Participation Quality:  Appropriate  Affect:  Flat  Cognitive:  Oriented  Insight:  Improving  Engagement in Group:  Limited  Engagement in Therapy:  Limited  Modes of Intervention:  Activity, Clarification, Education, Problem-solving and Support  Summary of Progress/Problems: Today's group addressed the issue of overcoming obstacles.  Patients were asked to identify their biggest obstacle post d/c that stands in the way of their on-going success, and then problem solve as to how to manage this.  Stayed the entire time.  Unable to tell if he was engaged or not.  "I like to help others; sometimes I need to help myself" was the extent of his contribution to the discussion. Jesus Bryan, Jesus Bryan 05/30/2016   2:45 PM

## 2016-05-31 ENCOUNTER — Encounter (HOSPITAL_COMMUNITY): Payer: Self-pay | Admitting: Emergency Medicine

## 2016-05-31 LAB — BASIC METABOLIC PANEL
Anion gap: 8 (ref 5–15)
BUN: 13 mg/dL (ref 6–20)
CO2: 25 mmol/L (ref 22–32)
Calcium: 8.8 mg/dL — ABNORMAL LOW (ref 8.9–10.3)
Chloride: 105 mmol/L (ref 101–111)
Creatinine, Ser: 1.05 mg/dL (ref 0.61–1.24)
GFR calc Af Amer: >60 mL/min (ref >60)
GFR calc non Af Amer: >60 mL/min (ref >60)
Glucose, Bld: 78 mg/dL (ref 65–99)
Potassium: 4.7 mmol/L (ref 3.5–5.1)
Sodium: 138 mmol/L (ref 135–145)

## 2016-05-31 LAB — LIPID PANEL
CHOL/HDL RATIO: 2.4 ratio
Cholesterol: 161 mg/dL (ref 0–200)
HDL: 67 mg/dL (ref >40)
LDL Cholesterol: 65 mg/dL (ref 0–99)
Triglycerides: 145 mg/dL (ref <150)
VLDL: 29 mg/dL (ref 0–40)

## 2016-05-31 LAB — GLUCOSE, CAPILLARY: Glucose-Capillary: 108 mg/dL — ABNORMAL HIGH (ref 65–99)

## 2016-05-31 LAB — VALPROIC ACID LEVEL: Valproic Acid Lvl: <10 ug/mL — ABNORMAL LOW (ref 50.0–100.0)

## 2016-05-31 LAB — TSH: TSH: 1.949 u[IU]/mL (ref 0.350–4.500)

## 2016-05-31 MED ORDER — LORAZEPAM 2 MG/ML IJ SOLN
INTRAMUSCULAR | Status: AC
  Administered 2016-05-31: 1 mg
  Filled 2016-05-31: qty 1

## 2016-05-31 MED ORDER — SODIUM CHLORIDE 0.9 % IV SOLN
1000.0000 mg | Freq: Once | INTRAVENOUS | Status: AC
  Administered 2016-05-31: 1000 mg via INTRAVENOUS
  Filled 2016-05-31: qty 10

## 2016-05-31 MED ORDER — VALPROATE SODIUM 500 MG/5ML IV SOLN
500.0000 mg | Freq: Once | INTRAVENOUS | Status: AC
  Administered 2016-05-31: 500 mg via INTRAVENOUS
  Filled 2016-05-31: qty 5

## 2016-05-31 NOTE — Progress Notes (Signed)
D: Pt denies SI/HI/AVH. Pt is pleasant and cooperative. Pt pt avoidant with Clinical research associatewriter.   A: Pt was offered support and encouragement. Pt was given scheduled medications. Pt was encourage to attend groups. Q 15 minute checks were done for safety.   R:Pt attends groups and interacts well with peers and staff. Pt is taking medication. Pt has no complaints at this time.Pt receptive to treatment and safety maintained on unit.

## 2016-05-31 NOTE — ED Notes (Signed)
[place] [facility]

## 2016-05-31 NOTE — Progress Notes (Signed)
Pt did not attend group. 

## 2016-05-31 NOTE — Progress Notes (Signed)
Pt was in the dayroom at the beginning of the shift, but went to his room before group time and spent the rest of the evening in bed.  He did not go to sleep until after his meds were given, but he did take his hs meds without incident.  Chocolate milk was the only thing he requested for snack, but there was none on the unit, so Clinical research associatewriter gave him a chocolate Ensure as he is small in stature and would probably benefit from having a meal supplement.  Pt denies SI/HI/AVH.  Conversation was minimal and all he would offer is that he wanted to show love to his daughter.  When writer asked about his daughter and how old she was, he laughed in a bizarre way and mumbled something that Clinical research associatewriter could not understand.  Writer asked him to repeat it, but he just laughed again.  Support and encouragement offered.  Pt was encouraged to make his needs known to staff.  Discharge plans are in process.  Safety maintained iwith q15 minute checks.

## 2016-05-31 NOTE — Progress Notes (Signed)
DAR NOTE: Patient transferred from the emergency department with no new orders.  Patient is alert and oriented to person, time and place.  Patient is calm and appropriate to situation.  Medication given as prescribed.  Patient ambulatory on the unit without difficulty.    Denies pain, auditory and visual hallucinations.  Maintained on routine safety checks. Support and encouragement offered as needed.    Offered no complaint.

## 2016-05-31 NOTE — ED Notes (Addendum)
Patient ambulated down the hallway without difficulty. Denies lightheaded, dizziness, or any other symptoms. EDP made aware.

## 2016-05-31 NOTE — ED Notes (Signed)
Patient will shake head yes and no. Patient not verbally answering questions. Patient will follow commands, such as helping move his arm to get blood pressure cuff on arm. However, patient did not squeeze either hand or move either lower extremity upon request.
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
## 2016-05-31 NOTE — ED Provider Notes (Signed)
[facility]-EMERGENCY DEPT Provider Note   CSN: [id number] Arrival date & time: 05/31/16  0865     History   Chief Complaint Chief Complaint  Patient presents with  . Seizures    HPI Jesus Bryan is a 26 y.o. male.  The history is provided by the patient.  Seizures   This is a recurrent problem. The current episode started less than 1 hour ago. The problem has been resolved. There was 1 seizure. The most recent episode lasted 30 to 120 seconds. Characteristics include rhythmic jerking. The episode was witnessed. The seizures did not continue in the ED. There has been no fever.   Patient was getting blood drawn while at behavioral health when he started to feel lightheaded per behavioral health staff. He was made to sit and reported feeling better. Upon standing patient felt lightheaded again and began having a seizure. He was lowered down to the floor and given Ativan. Seizure lasted approximately the 30-60 seconds.  Remainder of history, ROS, and physical exam limited due to patient's condition (AMS, sedation). Additional information was obtained from EMS and [facility] staff.   Level V Caveat.   Past Medical History:  Diagnosis Date  . Seizures [facility])     Patient Active Problem List   Diagnosis Date Noted  . Seizures (HCC) 05/30/2016  . Delusions (HCC)   . Schizoaffective disorder (HCC) 05/27/2016    Past Surgical History:  Procedure Laterality Date  . NO PAST SURGERIES         Home Medications    Prior to Admission medications   Medication Sig Start Date End Date Taking? Authorizing Provider  divalproex (DEPAKOTE) 500 MG DR tablet Take 750 mg by mouth at bedtime.    Historical Provider, MD  ferrous sulfate 325 (65 FE) MG tablet Take 325 mg by mouth daily with breakfast.    Historical Provider, MD  levETIRAcetam (KEPPRA) 500 MG tablet Take 500 mg by mouth 3 (three) times daily.    Historical Provider, MD  LORazepam (ATIVAN) 1 MG tablet Take 1 mg by mouth 2 (two) times  daily.    Historical Provider, MD  OLANZapine (ZYPREXA) 20 MG tablet Take 20 mg by mouth at bedtime.    Historical Provider, MD    Family History History reviewed. No pertinent family history.  Social History Social History  Substance Use Topics  . Smoking status: Current Every Day Smoker    Packs/day: 1.00    Years: 5.00  . Smokeless tobacco: Never Used     Comment: poor historian, could not give length of smoking history  . Alcohol use 4.2 oz/week    7 Glasses of wine per week     Comment: poor historian     Allergies   Peanut-containing drug products   Review of Systems Review of Systems  Unable to perform ROS: Other  Neurological: Positive for seizures.     Physical Exam Updated Vital Signs BP 91/62   Pulse 71   Temp 98 F (36.7 C) (Oral)   Resp 14   Ht 5\' 6"  (1.676 m)   Wt 118 lb (53.5 kg)   SpO2 100%   BMI 19.05 kg/m   Physical Exam  Constitutional: He is oriented to person, place, and time. He appears well-developed and well-nourished. He appears lethargic. No distress.  HENT:  Head: Normocephalic and atraumatic.  Nose: Nose normal.  Eyes: Conjunctivae and EOM are normal. Pupils are equal, round, and reactive to light. Right eye exhibits no discharge. Left eye exhibits  no discharge. No scleral icterus.  Neck: Normal range of motion. Neck supple.  Cardiovascular: Normal rate and regular rhythm.  Exam reveals no gallop and no friction rub.   No murmur heard. Pulmonary/Chest: Effort normal and breath sounds normal. No stridor. No respiratory distress. He has no rales.  Abdominal: Soft. He exhibits no distension. There is no tenderness.  Musculoskeletal: He exhibits no edema or tenderness.  Neurological: He is oriented to person, place, and time. He appears lethargic.  Skin: Skin is warm and dry. No rash noted. He is not diaphoretic. No erythema.  Psychiatric: He has a normal mood and affect.  Vitals reviewed.    ED Treatments / Results  Labs (all  labs ordered are listed, but only abnormal results are displayed) Labs Reviewed  GLUCOSE, CAPILLARY - Abnormal; Notable for the following:       Result Value   Glucose-Capillary 108 (*)    All other components within normal limits  VALPROIC ACID LEVEL - Abnormal; Notable for the following:    Valproic Acid Lvl <10 (*)    All other components within normal limits  BASIC METABOLIC PANEL - Abnormal; Notable for the following:    Calcium 8.8 (*)    All other components within normal limits  TSH  LIPID PANEL  HEMOGLOBIN A1C  PROLACTIN    EKG  EKG Interpretation None       Radiology No results found.  Procedures Procedures (including critical care time)  Medications Ordered in ED Medications  acetaminophen (TYLENOL) tablet 650 mg (not administered)  alum & mag hydroxide-simeth (MAALOX/MYLANTA) 200-200-20 MG/5ML suspension 30 mL (not administered)  magnesium hydroxide (MILK OF MAGNESIA) suspension 30 mL (not administered)  traZODone (DESYREL) tablet 50 mg (50 mg Oral Not Given 05/30/16 2300)  feeding supplement (ENSURE ENLIVE) (ENSURE ENLIVE) liquid 237 mL (237 mLs Oral Refused 05/31/16 0900)  levETIRAcetam (KEPPRA) tablet 500 mg (500 mg Oral Given 05/31/16 1205)  OLANZapine (ZYPREXA) tablet 10 mg (10 mg Oral Given 05/30/16 2130)  LORazepam (ATIVAN) tablet 1 mg (not administered)    Or  LORazepam (ATIVAN) injection 1 mg (not administered)  haloperidol (HALDOL) tablet 5 mg (not administered)    Or  haloperidol lactate (HALDOL) injection 5 mg (not administered)  diphenhydrAMINE (BENADRYL) capsule 25 mg (not administered)    Or  diphenhydrAMINE (BENADRYL) injection 25 mg (not administered)  LORazepam (ATIVAN) 2 MG/ML injection (1 mg  Given 05/31/16 0646)  valproate (DEPACON) 500 mg in dextrose 5 % 50 mL IVPB (0 mg Intravenous Stopped 05/31/16 1011)  levETIRAcetam (KEPPRA) 1,000 mg in sodium chloride 0.9 % 100 mL IVPB (0 mg Intravenous Stopped 05/31/16 1100)     Initial  Impression / Assessment and Plan / ED Course  I have reviewed the triage vital signs and the nursing notes.  Pertinent labs & imaging results that were available during my care of the patient were reviewed by me and considered in my medical decision making (see chart for details).     Labs grossly reassuring. Depakote level subtherapeutic. Loaded with Depakote and Keppra. Patient recovered from his postictal state. Was able to tolerate by mouth and ambulate without complications. Safe for discharge back to behavioral health for continued psychiatric management.  Final Clinical Impressions(s) / ED Diagnoses   Final diagnoses:  Seizure (HCC)   Disposition: Discharge  Condition: Good  I have discussed the results, Dx and Tx plan with the patient who expressed understanding and agree(s) with the plan. Discharge instructions discussed at great length. The patient  was given strict return precautions who verbalized understanding of the instructions. No further questions at time of discharge.    New Prescriptions   No medications on file    [facility]         Nira ConnPedro Eduardo Riyansh Gerstner, MD 05/31/16 1216

## 2016-05-31 NOTE — Plan of Care (Signed)
Problem: Safety: Goal: Periods of time without injury will increase Outcome: Progressing Pt safe on the unit at this time   

## 2016-05-31 NOTE — ED Notes (Signed)
Bed: [id number] Expected date:  Expected time:  Means of arrival:  Comments[facility]

## 2016-05-31 NOTE — ED Triage Notes (Signed)
[facility].  Pt was getting blood drawn and started seizing, was lowered to the floor.  3 minutes duration.  No fall during or recent injury.  Hx seizures.  [facility]. Alert and oriented but drowsy.  Airway patent. VS: 124/68, 108 CBG, 84 bpm, afebrile

## 2016-05-31 NOTE — Progress Notes (Addendum)
Time of seizure: ~0610  Reported that Pt. felt dizzy after lab drawn from [facility]. Pt. was given fluids to hydrate but Pt. spitted it out. Pt. came back to base unit to get vitals taken. RN Erskine SquibbJane came to assessed Pt. and gave Pt. chocolate milk per request. Pt. states to RN Erskine SquibbJane "Can I get my keppra?" RN Erskine SquibbJane asked writer to come stay with Pt. and then went to med. room to get medication. By the time she came back, Pt. began to have a seizure. RN Erskine SquibbJane and Clinical research associatewriter stayed with Pt. during seizure. Seizure lasted for a duration of about 5 minutes. Pt. was aware of self and surrounding. Pulse was 2+ radial bilaterally. Pt. was warm but clammy. RN Erskine SquibbJane called out for help. Both nurses prevented Pt. from falling to the floor. Oxygen given. Vitals x 2 taken; BP slightly elevated. See flow sheet. CBG obtained and was 108. Provider on call, EMS, charge nurse, and [facility] notified. Will continue to monitor.

## 2016-06-01 LAB — PROLACTIN: Prolactin: 79.1 ng/mL — ABNORMAL HIGH (ref 4.0–15.2)

## 2016-06-01 LAB — HEMOGLOBIN A1C
Hgb A1c MFr Bld: 5 % (ref 4.8–5.6)
Mean Plasma Glucose: 97 mg/dL

## 2016-06-01 MED ORDER — OLANZAPINE 7.5 MG PO TABS
15.0000 mg | ORAL_TABLET | Freq: Every day | ORAL | Status: DC
  Administered 2016-06-01 – 2016-06-02 (×2): 15 mg via ORAL
  Filled 2016-06-01 (×4): qty 2

## 2016-06-01 MED ORDER — CITALOPRAM HYDROBROMIDE 10 MG PO TABS
10.0000 mg | ORAL_TABLET | Freq: Every day | ORAL | Status: DC
Start: 2016-06-01 — End: 2016-06-03
  Administered 2016-06-03: 10 mg via ORAL
  Filled 2016-06-01 (×5): qty 1

## 2016-06-01 MED ORDER — DIVALPROEX SODIUM 250 MG PO DR TAB
250.0000 mg | DELAYED_RELEASE_TABLET | Freq: Three times a day (TID) | ORAL | Status: DC
  Administered 2016-06-01 (×2): 250 mg via ORAL
  Filled 2016-06-01 (×27): qty 1

## 2016-06-01 NOTE — [facility] Group Notes (Signed)
[facility] Group Notes:  (Counselor/Nursing/[facility]/Case Management/Adjunct)  06/01/2016 1:15PM  Type of Therapy:  Group Therapy  Participation Level:  Active  Participation Quality:  Appropriate  Affect:  Flat  Cognitive:  Oriented  Insight:  Improving  Engagement in Group:  Limited  Engagement in Therapy:  Limited  Modes of Intervention:  Discussion, Exploration and Socialization  Summary of Progress/Problems: The topic for group was balance in life.  Pt participated in the discussion about when their life was in balance and out of balance and how this feels.  Pt discussed ways to get back in balance and short term goals they can work on to get where they want to be.  Invited.  Initially chose to not attend. Came later.  Stated he feels balanced today.  "Ithink I am ready to go."  States he has never felt unbalanced.  "I'm good."  Later stated that he likes being at water, like a lake.  When talking about music, stated he likes "good music."  When asked for an example, stated "The sun will come out again, the rain will rain again.  I wrote it."  Daryel Geraldorth, Baylei Siebels B 06/01/2016 12:56 PM

## 2016-06-01 NOTE — Progress Notes (Signed)
Recreation Therapy Notes  Date: 06/01/16 Time: 1000 Location: 500 Hall Dayroom  Group Topic: Wellness  Goal Area(s) Addresses:  Patient will define components of whole wellness. Patient will verbalize benefit of whole wellness.  Behavioral Response: Engaged  Intervention:  ArchivistChairs, beach ball   Activity: Keep it ContractorGoing Volleyball.  Patients were positioned in a circle.  Patients were to toss a beach ball back and forth to each other without letting the ball come to a stop on the floor.  Patients could bounce the ball off the floor but the ball had to keep moving within the circle.  LRT would count the number hits on the ball.  Once the ball came to a complete stop the count started over.  Education: Wellness, Building control surveyorDischarge Planning.   Education Outcome: Acknowledges education/In group clarification offered/Needs additional education.   Clinical Observations/Feedback: Pt was flat but participated.  Pt was shown encouragement from peers.  Pt would smile and socialize with peers at times during the activity.   Caroll RancherMarjette Clarkson Rosselli, LRT/CTRS         Caroll RancherLindsay, Fumie Fiallo A 06/01/2016 12:05 PM

## 2016-06-01 NOTE — Progress Notes (Signed)
[facility] MD Progress Note  06/01/2016 11:24 AM Jesus Bryan  MRN:  [id number] Subjective:  Patient states " I am jesus . I know that.'   Objective:Patient seen and chart reviewed.Discussed patient with treatment team.  Pt today continues to be seen as disorganized, delusional, has poor insight in to his illness. Pt was asked by CSW to sign a release to obtain medical records from [facility] - where he was admitted few months ago. 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 Pt continues to need medication readjustment. Pt was loaded with depakote in ED for recent seizure , pt being a poor historian , is unable to give details about medications or past hx.    Principal Problem: Schizoaffective disorder ([place]) Diagnosis:   Patient Active Problem List   Diagnosis Date Noted  . Seizures ([place]) [R56.9] 05/30/2016  . Delusions ([place]) [F22]   . Schizoaffective disorder ([place]) [F25.9] 05/27/2016   Total Time spent with patient: 25 minutes  Past Psychiatric History: pt was admitted at [facility] in the past - pt however not a good historian and is not willing to sign a consent for release of information from [facility].  Past Medical History:  Past Medical History:  Diagnosis Date  . Seizures ([place])     Past Surgical History:  Procedure Laterality Date  . NO PAST SURGERIES     Family History: is disorganized , unable to comment , will need to get collateral information Family Psychiatric  History: same as above Social History:  History  Alcohol Use  . 4.2 oz/week  . 7 Glasses of wine per week    Comment: poor historian     History  Drug Use No    Social History   Social History  . Marital status: Single    Spouse name: N/A  . Number of children: N/A  . Years of education: N/A   Social History Main Topics  . Smoking status: Current Every Day Smoker    Packs/day: 1.00    Years: 5.00  . Smokeless tobacco: Never Used     Comment: poor historian, could not give  length of smoking history  . Alcohol use 4.2 oz/week    7 Glasses of wine per week     Comment: poor historian  . Drug use: No  . Sexual activity: No   Other Topics Concern  . None   Social History Narrative  . None   Additional Social History:                         Sleep: observed as fair  Appetite:  Fair  Current Medications: Current Facility-Administered Medications  Medication Dose Route Frequency Provider Last Rate Last Dose  . acetaminophen (TYLENOL) tablet 650 mg  650 mg Oral Q6H PRN Derrill Center, NP      . alum & mag hydroxide-simeth (MAALOX/MYLANTA) 200-200-20 MG/5ML suspension 30 mL  30 mL Oral Q4H PRN Derrill Center, NP      . citalopram (CELEXA) tablet 10 mg  10 mg Oral Daily Jessika Rothery, MD      . diphenhydrAMINE (BENADRYL) capsule 25 mg  25 mg Oral Q8H PRN Ursula Alert, MD       Or  . diphenhydrAMINE (BENADRYL) injection 25 mg  25 mg Intramuscular Q6H PRN Alexyia Guarino, MD      . divalproex (DEPAKOTE) DR tablet 250 mg  250 mg Oral Q8H  Ursula Alert, MD      . feeding supplement (ENSURE ENLIVE) (ENSURE ENLIVE) liquid 237 mL  237 mL Oral BID BM Bilbo Carcamo, MD   237 mL at 05/30/16 1616  . haloperidol (HALDOL) tablet 5 mg  5 mg Oral Q8H PRN Ursula Alert, MD       Or  . haloperidol lactate (HALDOL) injection 5 mg  5 mg Intramuscular Q8H PRN Joleen Stuckert, MD      . levETIRAcetam (KEPPRA) tablet 500 mg  500 mg Oral TID Derrill Center, NP   500 mg at 06/01/16 0847  . LORazepam (ATIVAN) tablet 1 mg  1 mg Oral Q6H PRN Ursula Alert, MD   1 mg at 05/31/16 2110   Or  . LORazepam (ATIVAN) injection 1 mg  1 mg Intramuscular Q6H PRN Deanta Mincey, MD      . magnesium hydroxide (MILK OF MAGNESIA) suspension 30 mL  30 mL Oral Daily PRN Derrill Center, NP      . OLANZapine (ZYPREXA) tablet 15 mg  15 mg Oral QHS Izaya Netherton, MD      . traZODone (DESYREL) tablet 50 mg  50 mg Oral QHS,MR X 1 Derrill Center, NP   50 mg at 05/31/16 2110    Lab  Results:  Results for orders placed or performed during the hospital encounter of 05/27/16 (from the past 48 hour(s))  TSH     Status: None   Collection Time: 05/31/16  6:11 AM  Result Value Ref Range   TSH 1.949 0.350 - 4.500 uIU/mL    Comment: Performed by a 3rd Generation assay with a functional sensitivity of <=0.01 uIU/mL. Performed at [facility], [place] [address], [place], [place] [address]   Lipid panel     Status: None   Collection Time: 05/31/16  6:11 AM  Result Value Ref Range   Cholesterol 161 0 - 200 mg/dL   Triglycerides 145 <150 mg/dL   HDL 67 >40 mg/dL   Total CHOL/HDL Ratio 2.4 RATIO   VLDL 29 0 - 40 mg/dL   LDL Cholesterol 65 0 - 99 mg/dL    Comment:        Total Cholesterol/HDL:CHD Risk Coronary Heart Disease Risk Table                     Men   Women  1/2 Average Risk   3.4   3.3  Average Risk       5.0   4.4  2 X Average Risk   9.6   7.1  3 X Average Risk  23.4   11.0        Use the calculated Patient Ratio above and the CHD Risk Table to determine the patient's CHD Risk.        ATP III CLASSIFICATION (LDL):  <100     mg/dL   Optimal  100-129  mg/dL   Near or Above                    Optimal  130-159  mg/dL   Borderline  160-189  mg/dL   High  >190     mg/dL   Very High Performed at [facility] [address] [place], [place] [address]   Hemoglobin A1c     Status: None   Collection Time: 05/31/16  6:11 AM  Result Value Ref Range   Hgb A1c MFr Bld 5.0 4.8 - 5.6 %  Comment: (NOTE)         Pre-diabetes: 5.7 - 6.4         Diabetes: >6.4         Glycemic control for adults with diabetes: <7.0    Mean Plasma Glucose 97 mg/dL    Comment: (NOTE) Performed At: [facility] [place], [place] [id number] Lindon Romp MD [id number] Performed at [facility], [place] [address] [place], [place] [address]   Prolactin     Status: Abnormal   Collection Time: 05/31/16  6:11 AM   Result Value Ref Range   Prolactin 79.1 (H) 4.0 - 15.2 ng/mL    Comment: (NOTE) Performed At: [facility] [place], [place] [id number] Lindon Romp MD [id number] Performed at [facility], [place] [address], [place], [place] [address]   Glucose, capillary     Status: Abnormal   Collection Time: 05/31/16  6:32 AM  Result Value Ref Range   Glucose-Capillary 108 (H) 65 - 99 mg/dL  Valproic acid level     Status: Abnormal   Collection Time: 05/31/16  7:44 AM  Result Value Ref Range   Valproic Acid Lvl <10 (L) 50.0 - 100.0 ug/mL    Comment: RESULTS CONFIRMED BY MANUAL DILUTION  Basic metabolic panel     Status: Abnormal   Collection Time: 05/31/16  7:44 AM  Result Value Ref Range   Sodium 138 135 - 145 mmol/L   Potassium 4.7 3.5 - 5.1 mmol/L   Chloride 105 101 - 111 mmol/L   CO2 25 22 - 32 mmol/L   Glucose, Bld 78 65 - 99 mg/dL   BUN 13 6 - 20 mg/dL   Creatinine, Ser 1.05 0.61 - 1.24 mg/dL   Calcium 8.8 (L) 8.9 - 10.3 mg/dL   GFR calc non Af Amer >60 >60 mL/min   GFR calc Af Amer >60 >60 mL/min    Comment: (NOTE) The eGFR has been calculated using the CKD EPI equation. This calculation has not been validated in all clinical situations. eGFR's persistently <60 mL/min signify possible Chronic Kidney Disease.    Anion gap 8 5 - 15    Blood Alcohol level:  Lab Results  Component Value Date   ETH <5 58/12/9831    Metabolic Disorder Labs: Lab Results  Component Value Date   HGBA1C 5.0 05/31/2016   MPG 97 05/31/2016   Lab Results  Component Value Date   PROLACTIN 79.1 (H) 05/31/2016   Lab Results  Component Value Date   CHOL 161 05/31/2016   TRIG 145 05/31/2016   HDL 67 05/31/2016   CHOLHDL 2.4 05/31/2016   VLDL 29 05/31/2016   LDLCALC 65 05/31/2016    Physical Findings: AIMS: Facial and Oral Movements Muscles of Facial Expression: None, normal Lips and Perioral Area: None, normal Jaw: None, normal Tongue:  None, normal,Extremity Movements Upper (arms, wrists, hands, fingers): None, normal Lower (legs, knees, ankles, toes): None, normal, Trunk Movements Neck, shoulders, hips: None, normal, Overall Severity Severity of abnormal movements (highest score from questions above): None, normal Incapacitation due to abnormal movements: None, normal Patient's awareness of abnormal movements (rate only patient's report): No Awareness, Dental Status Current problems with teeth and/or dentures?: No Does patient usually wear dentures?: No  CIWA:    COWS:     Musculoskeletal: Strength & Muscle Tone: within normal limits Gait & Station: normal Patient leans: N/A  Psychiatric Specialty Exam: Physical Exam  Nursing note and vitals reviewed.  Review of Systems  Psychiatric/Behavioral: The patient is nervous/anxious.   All other systems reviewed and are negative.   Blood pressure (!) 106/57, pulse 68, temperature 97.9 F (36.6 C), temperature source Oral, resp. rate 16, height '5\' 6"'$  (1.676 m), weight 53.5 kg (118 lb), SpO2 100 %.Body mass index is 19.05 kg/m.  General Appearance: Guarded  Eye Contact:  Fair  Speech:  Blocked  Volume:  Normal  Mood:  Anxious and Dysphoric  Affect:  Congruent  Thought Process:  Disorganized, Irrelevant and Descriptions of Associations: Intact  Orientation:  Full (Time, Place, and Person)  Thought Content:  Delusions, Paranoid Ideation and Rumination  Suicidal Thoughts:  No  Homicidal Thoughts:  No  Memory:  Immediate;   Fair Recent;   Poor Remote;   Poor  Judgement:  Impaired  Insight:  Shallow  Psychomotor Activity:  Normal  Concentration:  Concentration: Fair and Attention Span: Fair  Recall:  [employer] of Knowledge:  Fair  Language:  Fair  Akathisia:  No  Handed:  Right  AIMS (if indicated):     Assets:  Desire for Improvement  ADL's:  Intact  Cognition:  WNL  Sleep:  Number of Hours: 6.5   Schizoaffective disorder ([place]) unstable  Will  continue today 06/01/16 plan as below except where it is noted.   Treatment Plan Summary:Patient seen as delusional , grandiose , disorganized, has thought blocking , is a limited historian - will readjust medications , continue to treat.  Daily contact with patient to assess and evaluate symptoms and progress in treatment, Medication management and Plan see below  For schizoaffective do: Increase Zyprexa to 15 mg po qhs.  For insomnia: Trazodone 50 mg po qhs prn.  For anxiety/agitation: PRN medications as per unit protocol.  For seizure do: Keppra 500 mg po tid.                           Depakote DR 250 mg po tid . depakote level in 3 days.  CT scan brain - reviewed - no acute abnormalities.  Reviewed - tsh- wnl , lipid panel - wnl, Hba1c- pending .  CSW will continue to work on obtaining medical records and collateral information.pt refused to sign consent .  Ewing Fandino, MD 06/01/2016, 11:24 AM

## 2016-06-01 NOTE — Progress Notes (Signed)
Nursing Note 06/01/2016 [id number]  Data Reports sleeping well.  Did not complete self-inventory.  Patient tangential and bizarre.  Affect blunted.  Denies HI, SI, AVH.  Refusing meds except for Keppra.  Took a dose of depakote in afternoon after a lot of encouragement.  Patient attending groups.  Action Spoke with patient 1:1, nurse offered support to patient throughout shift.  Continues to be monitored on 15 minute checks for safety.  Response Remains safe on unit.  No seizure activity observed.

## 2016-06-01 NOTE — [facility] Counselor (Signed)
Unable to try to interview patient yesterday as he was in ED due to a sezure.  Today, when attempted, patient was evasive and suspicious.  Unable to complete.  Contacted [facility] yesterday, who was able to tell me that [facility] 17.  Had no information on providers since then.

## 2016-06-01 NOTE — Progress Notes (Signed)
Adult Psychoeducational Group Note  Date:  06/01/2016 Time:  9:16 PM  Group Topic/Focus:  Wrap-Up Group:   The focus of this group is to help patients review their daily goal of treatment and discuss progress on daily workbooks.  Participation Level:  Minimal  Participation Quality:  Appropriate  Affect:  Flat  Cognitive:  Lacking  Insight: Limited  Engagement in Group:  Engaged and Supportive  Modes of Intervention:  Socialization and Support  Additional Comments:  Patient attended and participated in group tonight. He reports having an OK day. He thank the staff for taking care of him. He had to be redirected several times for being off topic. He advised that he went to his meals and attended his groups.   Lita MainsFrancis, Corian Handley [facility] 06/01/2016, 9:16 PM

## 2016-06-01 NOTE — Plan of Care (Signed)
Problem: Safety: Goal: Periods of time without injury will increase Outcome: Progressing Pt safe on the unit at this time   

## 2016-06-01 NOTE — Progress Notes (Signed)
D: Pt denies SI/HI/AVH. Pt is pleasant and cooperative. Pt seen on the milieu acting appropriately, pt continues to be paranoid and appears to be responding to internal stimuli even though he denies.   A: Pt was offered support and encouragement. Pt was given scheduled medications. Pt was encourage to attend groups. Q 15 minute checks were done for safety.   R:Pt attends groups and interacts well with peers and staff. Pt is taking medication. Pt has no complaints.Pt receptive to treatment and safety maintained on unit.

## 2016-06-02 ENCOUNTER — Encounter (HOSPITAL_COMMUNITY): Payer: Self-pay

## 2016-06-02 ENCOUNTER — Ambulatory Visit: Payer: Medicaid - Out of State | Admitting: Neurology

## 2016-06-02 ENCOUNTER — Telehealth: Payer: Self-pay | Admitting: Neurology

## 2016-06-02 LAB — GLUCOSE, CAPILLARY: Glucose-Capillary: 181 mg/dL — ABNORMAL HIGH (ref 65–99)

## 2016-06-02 MED ORDER — TRAZODONE HCL 100 MG PO TABS
100.0000 mg | ORAL_TABLET | Freq: Every day | ORAL | Status: DC
  Administered 2016-06-02 – 2016-06-14 (×13): 100 mg via ORAL
  Filled 2016-06-02 (×14): qty 1

## 2016-06-02 MED ORDER — SODIUM CHLORIDE 0.9 % IV SOLN
1000.0000 mg | Freq: Once | INTRAVENOUS | Status: AC
  Administered 2016-06-02: 1000 mg via INTRAVENOUS
  Filled 2016-06-02: qty 10

## 2016-06-02 MED ORDER — LEVETIRACETAM 500 MG PO TABS
1000.0000 mg | ORAL_TABLET | Freq: Two times a day (BID) | ORAL | Status: DC
  Administered 2016-06-02 – 2016-06-06 (×8): 1000 mg via ORAL
  Filled 2016-06-02 (×10): qty 2

## 2016-06-02 NOTE — [facility] Group Notes (Signed)
Type of Therapy:  Group Therapy   Participation Level:  Engaged  Participation Quality:  Attentive  Affect:  Appropriate   Cognitive:  Alert   Insight:  Engaged  Engagement in Therapy:  Improving   Mode s of Intervention:  Education, Exploration, Socialization   Summary of Progress/Problems: Invited, chose not to attend.   Tammi from [employer] was here to tell her story of recovery and inform patients about [facility] and their services.     

## 2016-06-02 NOTE — ED Notes (Signed)
Patient ambulated to the bathroom. Sitter remains at the bedside.

## 2016-06-02 NOTE — Progress Notes (Signed)
1:1 Note: Patient maintained on constant supervision for safety.  Patient is alert and oriented to person and place.  Patient is on his bed resting.  Medication given as prescribed.  Patient refused Depakote after several encouragements. Patient states, "I don't want to take too many medications."  Routine safety checks continues.  No seizure episode noted this shift.  Patient is safe with supervision.

## 2016-06-02 NOTE — ED Triage Notes (Signed)
Per EMS- [facility]. Patient has been refusing depakote, but taking the Keppra. Patient has had a total of [facility]. The last, 30 minutes ago. Patient was given Ativan 1 mg IM prior o EMS arrival. Patient post-itcal upon EMS arrival.

## 2016-06-02 NOTE — ED Notes (Signed)
[facility] called for transportation to [facility]. 

## 2016-06-02 NOTE — ED Notes (Signed)
[facility].

## 2016-06-02 NOTE — Progress Notes (Signed)
1:1 Note: Patient maintained on constant supervision for safety.  Patient is alert and oriented to person and place.  Patient is sitting up in bed.  No seizure activity noted.  Patient is calm and appropriate to situation.  Medication given as prescribed.  Routine safety checks continues.  Patient is safe with supervision.

## 2016-06-02 NOTE — ED Provider Notes (Signed)
[facility]-EMERGENCY DEPT Provider Note   CSN: [id number] Arrival date & time: 05/31/16  4098     History   Chief Complaint Chief Complaint  Patient presents with  . Seizures    HPI Jesus Bryan is a 26 y.o. male.  HPI  26 y.o. male with a hx of Seizures, presents to the Emergency Department today complaining of seizure activity. Called [facility] nurse who stated that pt was observed having seizure activity lasting for one minute in duration. Noted groaning and moaning during episode. Tonic-clonic movement with Pt was given Keppra 500mg  PO prior to episode. Given 1mg  Ativan during seizure activity with resolution. No N/V. No CP/SOB/ABD pain. No other symptoms noted. Pt does note that he takes Keppra TID, but refuses to take Depakote. Does not elaborate why he does not take.    Seizure medication Regiment: Keppra 500mg  TID and Depakote 750mg  at Night  Past Medical History:  Diagnosis Date  . Seizures [facility])     Patient Active Problem List   Diagnosis Date Noted  . Seizures (HCC) 05/30/2016  . Delusions (H[facility])   . Schizoaffective disorder (HCC) 05/27/2016    Past Surgical History:  Procedure Laterality Date  . NO PAST SURGERIES         Home Medications    Prior to Admission medications   Medication Sig Start Date End Date Taking? Authorizing Provider  divalproex (DEPAKOTE) 500 MG DR tablet Take 750 mg by mouth at bedtime.    Historical Provider, MD  ferrous sulfate 325 (65 FE) MG tablet Take 325 mg by mouth daily with breakfast.    Historical Provider, MD  levETIRAcetam (KEPPRA) 500 MG tablet Take 500 mg by mouth 3 (three) times daily.    Historical Provider, MD  LORazepam (ATIVAN) 1 MG tablet Take 1 mg by mouth 2 (two) times daily.    Historical Provider, MD  OLANZapine (ZYPREXA) 20 MG tablet Take 20 mg by mouth at bedtime.    Historical Provider, MD    Family History History reviewed. No pertinent family history.  Social History Social History  Substance Use Topics  .  Smoking status: Current Every Day Smoker    Packs/day: 1.00    Years: 5.00  . Smokeless tobacco: Never Used     Comment: poor historian, could not give length of smoking history  . Alcohol use 4.2 oz/week    7 Glasses of wine per week     Comment: poor historian     Allergies   Peanut-containing drug products   Review of Systems Review of Systems ROS reviewed and all are negative for acute change except as noted in the HPI.  Physical Exam Updated Vital Signs BP 105/60   Pulse 94   Temp 98.4 F (36.9 C)   Resp 16   Ht 5\' 6"  (1.676 m)   Wt 53.5 kg   SpO2 99%   BMI 19.05 kg/m   Physical Exam  Constitutional: He is oriented to person, place, and time. Vital signs are normal. He appears well-developed and well-nourished.  HENT:  Head: Normocephalic and atraumatic.  Right Ear: Hearing normal.  Left Ear: Hearing normal.  Eyes: Conjunctivae and EOM are normal. Pupils are equal, round, and reactive to light.  Neck: Normal range of motion. Neck supple.  Cardiovascular: Normal rate, regular rhythm, normal heart sounds and intact distal pulses.   Pulmonary/Chest: Effort normal and breath sounds normal.  Abdominal: Soft. There is no tenderness.  Musculoskeletal: Normal range of motion.  Neurological: He is alert  and oriented to person, place, and time.  Skin: Skin is warm and dry.  Psychiatric: He has a normal mood and affect. His speech is normal and behavior is normal. Thought content normal.  Nursing note and vitals reviewed.  ED Treatments / Results  Labs (all labs ordered are listed, but only abnormal results are displayed) Labs Reviewed  PROLACTIN - Abnormal; Notable for the following:       Result Value   Prolactin 79.1 (*)    All other components within normal limits  GLUCOSE, CAPILLARY - Abnormal; Notable for the following:    Glucose-Capillary 108 (*)    All other components within normal limits  VALPROIC ACID LEVEL - Abnormal; Notable for the following:     Valproic Acid Lvl <10 (*)    All other components within normal limits  BASIC METABOLIC PANEL - Abnormal; Notable for the following:    Calcium 8.8 (*)    All other components within normal limits  GLUCOSE, CAPILLARY - Abnormal; Notable for the following:    Glucose-Capillary 181 (*)    All other components within normal limits  TSH  LIPID PANEL  HEMOGLOBIN A1C    EKG  EKG Interpretation None       Radiology No results found.  Procedures Procedures (including critical care time)  Medications Ordered in ED Medications  acetaminophen (TYLENOL) tablet 650 mg (not administered)  alum & mag hydroxide-simeth (MAALOX/MYLANTA) 200-200-20 MG/5ML suspension 30 mL (not administered)  magnesium hydroxide (MILK OF MAGNESIA) suspension 30 mL (not administered)  traZODone (DESYREL) tablet 50 mg (50 mg Oral Not Given 06/01/16 2300)  feeding supplement (ENSURE ENLIVE) (ENSURE ENLIVE) liquid 237 mL (237 mLs Oral Given 06/01/16 2141)  levETIRAcetam (KEPPRA) tablet 500 mg (500 mg Oral Given 06/01/16 1641)  LORazepam (ATIVAN) tablet 1 mg ( Oral See Alternative 06/02/16 0751)    Or  LORazepam (ATIVAN) injection 1 mg (1 mg Intramuscular Given 06/02/16 0751)  haloperidol (HALDOL) tablet 5 mg (not administered)    Or  haloperidol lactate (HALDOL) injection 5 mg (not administered)  diphenhydrAMINE (BENADRYL) capsule 25 mg (not administered)    Or  diphenhydrAMINE (BENADRYL) injection 25 mg (not administered)  OLANZapine (ZYPREXA) tablet 15 mg (15 mg Oral Given 06/01/16 2140)  citalopram (CELEXA) tablet 10 mg (10 mg Oral Not Given 06/01/16 1300)  divalproex (DEPAKOTE) DR tablet 250 mg (250 mg Oral Given 06/01/16 2140)  LORazepam (ATIVAN) 2 MG/ML injection (1 mg  Given 05/31/16 0646)  valproate (DEPACON) 500 mg in dextrose 5 % 50 mL IVPB (0 mg Intravenous Stopped 05/31/16 1011)  levETIRAcetam (KEPPRA) 1,000 mg in sodium chloride 0.9 % 100 mL IVPB (0 mg Intravenous Stopped 05/31/16 1100)     Initial  Impression / Assessment and Plan / ED Course  I have reviewed the triage vital signs and the nursing notes.  Pertinent labs & imaging results that were available during my care of the patient were reviewed by me and considered in my medical decision making (see chart for details).  Final Clinical Impressions(s) / ED Diagnoses  {I have reviewed and evaluated the relevant laboratory values.   {I have reviewed the relevant previous healthcare records.  {I obtained HPI from historian.   ED Course:  Assessment: Pt is a 26 y.o. male with hx seizures who presents with seizure activity today. Hx same on the 27th. Pt takes is supposed to take Keppra and Depakote. [facility] notes compliance with Keppra, but refuses Depakote for unknown reasons. On exam, pt in NAD. Nontoxic/nonseptic  appearing. VSS. Afebrile. Lungs CTA. Heart RRR. Abdomen nontender soft. Post ictal on arrival. No observed seizure like activity in ED. Given Keppra loading dose in ED. Pt with seizure due to medical non compliance. Consult to Neurology recommended 1000mg  Keppra BID. Plan is to DC back to [facility]. Medical stable at transfer.   Disposition/Plan:  DC to [facility]  Supervising Physician Shaune Pollack, MD  Final diagnoses:  Seizure [facility])    New Prescriptions New Prescriptions   No medications on file     Audry Pili, PA-C 06/02/16 1610    Shaune Pollack, MD 06/02/16 [phone]

## 2016-06-02 NOTE — ED Notes (Signed)
Bed: WA03 Expected date:  Expected time:  Means of arrival:  Comments[facility]

## 2016-06-02 NOTE — Progress Notes (Signed)
Did not attend group 

## 2016-06-02 NOTE — Telephone Encounter (Signed)
This patient did not show for a new patient appointment today. 

## 2016-06-02 NOTE — Progress Notes (Signed)
Nursing 1:1 note D:Pt observed lying in bed with eyes closed. RR even and unlabored. No distress noted. A: 1:1 observation continues for safety  R: pt remains safe  

## 2016-06-02 NOTE — Progress Notes (Signed)
DAR NOTE: Patient observed having seizure activities that lasted for one minute in duration.  Patient was groaning and moaning during the episode.  Patient responded to tactile stimulation.  Vital signs result of 98.5, pulse between 117-125, 112/62, oxygen saturation 99- 100% room air.  Keppra 500 mg given orally prior to episode.  Ativan 1 mg given.  [facility] medical [facility] and treatment.

## 2016-06-02 NOTE — ED Notes (Signed)
Patient discharged via [facility] and [facility].

## 2016-06-03 MED ORDER — CITALOPRAM HYDROBROMIDE 20 MG PO TABS
20.0000 mg | ORAL_TABLET | Freq: Every day | ORAL | Status: DC
  Administered 2016-06-04 – 2016-06-05 (×2): 20 mg via ORAL
  Filled 2016-06-03 (×4): qty 1

## 2016-06-03 MED ORDER — OLANZAPINE 10 MG PO TABS
20.0000 mg | ORAL_TABLET | Freq: Every day | ORAL | Status: DC
  Administered 2016-06-03 – 2016-06-05 (×3): 20 mg via ORAL
  Filled 2016-06-03 (×4): qty 2

## 2016-06-03 NOTE — Progress Notes (Signed)
[facility] Post 1:1 Observation Documentation  For the first (8) hours following discontinuation of 1:1 precautions, a progress note entry by nursing staff should be documented at least every 2 hours, reflecting the patient's behavior, condition, mood, and conversation.  Use the progress notes for additional entries.  Time 1:1 discontinued:  1000 am     Patient's Behavior:  Patient is calm cooperative.  Patient's gate has been noted to be steady and patient has been free of falls.   Patient's Condition:  Patient has been observed as safe and free of falls and seizure activity at this time    Patient's Conversation:  Patient limited and guarded in conversation.  Patient denies SI, HI and AVH.   [name] 06/03/2016, 10:00 AM

## 2016-06-03 NOTE — [facility] Group Notes (Signed)
[facility] LCSW Group Therapy  06/03/2016  1:05 PM  Type of Therapy:  Group therapy  Participation Level:  Active  Participation Quality:  Attentive  Affect:  Flat  Cognitive:  Oriented  Insight:  Limited  Engagement in Therapy:  Limited  Modes of Intervention:  Discussion, Socialization  Summary of Progress/Problems:  Chaplain was here to lead a group on themes of hope and courage.  Invited.  Chose to not attend.  Daryel Geraldorth, Arminda Foglio B 06/03/2016 1:34 PM

## 2016-06-03 NOTE — Progress Notes (Signed)
Recreation Therapy Notes  Date: 06/03/16 Time: 1000 Location: 500 Hall Dayroom  Group Topic: Self-Esteem  Goal Area(s) Addresses:  Patient will identify positive ways to increase self-esteem. Patient will verbalize benefit of increased self-esteem.  Behavioral Response: Minimal  Intervention: Sheet with a blank mask, colored pencils  Activity: How I See Me.  Patiens were given a blank mask.  Patients were to draw a picture of how they see themselves. Once patients were finished with the picture, they were write on the outside of the picture their positive qualities.  Education:  Self-Esteem, Building control surveyorDischarge Planning.   Education Outcome: Acknowledges education/In group clarification offered/Needs additional education  Clinical Observations/Feedback: Pt was mostly observant, did a little of the activity with prompting.  Pt expressed he was thankful for being here.  One peer of the pt stated he showed concern for her he saw her crying earlier and she expressed how much that meant to her.   Caroll RancherMarjette Annaya Bangert, LRT/CTRS     Caroll RancherLindsay, Charmon Thorson A 06/03/2016 11:27 AM

## 2016-06-03 NOTE — Tx Team (Signed)
Interdisciplinary Treatment and Diagnostic Plan Update  06/03/2016 Time of Session: 9:59 AM  Jesus Bryan MRN: [id number]  Principal Diagnosis: Schizoaffective disorder [facility])  Secondary Diagnoses: Principal Problem:   Schizoaffective disorder ([place]) Active Problems:   Delusions ([place])   Seizures ([place])   Current Medications:  Current Facility-Administered Medications  Medication Dose Route Frequency Provider Last Rate Last Dose  . acetaminophen (TYLENOL) tablet 650 mg  650 mg Oral Q6H PRN Derrill Center, NP      . alum & mag hydroxide-simeth (MAALOX/MYLANTA) 200-200-20 MG/5ML suspension 30 mL  30 mL Oral Q4H PRN Derrill Center, NP      . citalopram (CELEXA) tablet 10 mg  10 mg Oral Daily Ursula Alert, MD   10 mg at 06/03/16 0804  . diphenhydrAMINE (BENADRYL) capsule 25 mg  25 mg Oral Q8H PRN Ursula Alert, MD       Or  . diphenhydrAMINE (BENADRYL) injection 25 mg  25 mg Intramuscular Q6H PRN Saramma Eappen, MD      . divalproex (DEPAKOTE) DR tablet 250 mg  250 mg Oral Q8H Saramma Eappen, MD   250 mg at 06/01/16 2140  . feeding supplement (ENSURE ENLIVE) (ENSURE ENLIVE) liquid 237 mL  237 mL Oral BID BM Saramma Eappen, MD   237 mL at 06/02/16 1531  . haloperidol (HALDOL) tablet 5 mg  5 mg Oral Q8H PRN Ursula Alert, MD       Or  . haloperidol lactate (HALDOL) injection 5 mg  5 mg Intramuscular Q8H PRN Saramma Eappen, MD      . levETIRAcetam (KEPPRA) tablet 1,000 mg  1,000 mg Oral BID Ursula Alert, MD   1,000 mg at 06/03/16 0803  . LORazepam (ATIVAN) tablet 1 mg  1 mg Oral Q6H PRN Ursula Alert, MD   1 mg at 06/03/16 0817   Or  . LORazepam (ATIVAN) injection 1 mg  1 mg Intramuscular Q6H PRN Ursula Alert, MD   1 mg at 06/02/16 0751  . magnesium hydroxide (MILK OF MAGNESIA) suspension 30 mL  30 mL Oral Daily PRN Derrill Center, NP      . OLANZapine (ZYPREXA) tablet 15 mg  15 mg Oral QHS Ursula Alert, MD   15 mg at 06/02/16 2036  . traZODone (DESYREL) tablet 100 mg  100 mg Oral  QHS Rozetta Nunnery, NP   100 mg at 06/02/16 2036    PTA Medications: Prescriptions Prior to Admission  Medication Sig Dispense Refill Last Dose  . divalproex (DEPAKOTE) 500 MG DR tablet Take 750 mg by mouth at bedtime.     . ferrous sulfate 325 (65 FE) MG tablet Take 325 mg by mouth daily with breakfast.     . levETIRAcetam (KEPPRA) 500 MG tablet Take 500 mg by mouth 3 (three) times daily.     Marland Kitchen LORazepam (ATIVAN) 1 MG tablet Take 1 mg by mouth 2 (two) times daily.     Marland Kitchen OLANZapine (ZYPREXA) 20 MG tablet Take 20 mg by mouth at bedtime.       Treatment Modalities: Medication Management, Group therapy, Case management,  1 to 1 session with clinician, Psychoeducation, Recreational therapy.   Physician Treatment Plan for Primary Diagnosis: Schizoaffective disorder ([place]) Long Term Goal(s): Improvement in symptoms so as ready for discharge  Short Term Goals: Ability to verbalize feelings will improve Ability to disclose and discuss suicidal ideas Ability to demonstrate self-control will improve Compliance with prescribed medications will improve Ability to identify changes in lifestyle to reduce recurrence of  condition will improve Ability to maintain clinical measurements within normal limits will improve Compliance with prescribed medications will improve Ability to identify triggers associated with substance abuse/mental health issues will improve  Medication Management: Evaluate patient's response, side effects, and tolerance of medication regimen.  Therapeutic Interventions: 1 to 1 sessions, Unit Group sessions and Medication administration.  Evaluation of Outcomes: Progressing  Physician Treatment Plan for Secondary Diagnosis: Principal Problem:   Schizoaffective disorder ([place]) Active Problems:   Delusions ([place])   Seizures ([place])   Long Term Goal(s): Improvement in symptoms so as ready for discharge  Short Term Goals: Ability to verbalize feelings will improve Ability to  disclose and discuss suicidal ideas Ability to demonstrate self-control will improve Compliance with prescribed medications will improve Ability to identify changes in lifestyle to reduce recurrence of condition will improve Ability to maintain clinical measurements within normal limits will improve Compliance with prescribed medications will improve Ability to identify triggers associated with substance abuse/mental health issues will improve  Medication Management: Evaluate patient's response, side effects, and tolerance of medication regimen.  Therapeutic Interventions: 1 to 1 sessions, Unit Group sessions and Medication administration.  Evaluation of Outcomes: Progressing   3/2:  :Patient seen as grandiose , delusional, making irrelevant statements , has thought blocking , is a limited historian , attempted to obtain collateral information - unable to reach family.  For schizoaffective do : Increase Zyprexa to 20 mg po qhs.  For insomnia: Trazodone 50 mg po qhs prn.  For anxiety/agitation: PRN medications as per unit protocol.  For seizure do:  Increase Keppra to 1000 mg po bid as per neurology consult per EDP.                           Depakote DR 250 mg po tid . Pt refusing depakote.  Will discontinue 1:1 precaution for safety reasons.Will reassess.  CT scan brain - reviewed - no acute abnormalities.   RN Treatment Plan for Primary Diagnosis: Schizoaffective disorder ([place]) Long Term Goal(s): Knowledge of disease and therapeutic regimen to maintain health will improve  Short Term Goals: Ability to identify and develop effective coping behaviors will improve and Compliance with prescribed medications will improve  Medication Management: RN will administer medications as ordered by provider, will assess and evaluate patient's response and provide education to patient for prescribed medication. RN will report any adverse and/or side effects to prescribing  provider.  Therapeutic Interventions: 1 on 1 counseling sessions, Psychoeducation, Medication administration, Evaluate responses to treatment, Monitor vital signs and CBGs as ordered, Perform/monitor CIWA, COWS, AIMS and Fall Risk screenings as ordered, Perform wound care treatments as ordered.  Evaluation of Outcomes: Progressing   LCSW Treatment Plan for Primary Diagnosis: Schizoaffective disorder ([place]) Long Term Goal(s): Safe transition to appropriate next level of care at discharge, Engage patient in therapeutic group addressing interpersonal concerns.  Short Term Goals: Engage patient in aftercare planning with referrals and resources  Therapeutic Interventions: Assess for all discharge needs, 1 to 1 time with Social worker, Explore available resources and support systems, Assess for adequacy in community support network, Educate family and significant other(s) on suicide prevention, Complete Psychosocial Assessment, Interpersonal group therapy.  Evaluation of Outcomes: Met  Return home, follow up outpt   Progress in Treatment: Attending groups: Yes Participating in groups: Minimally Taking medication as prescribed: Yes Toleration medication: Yes, no side effects reported at this time Family/Significant other contact made: No  Left message for SO at number  listed in chart Patient understands diagnosis: No  Limited insight Discussing patient identified problems/goals with staff: Yes Medical problems stabilized or resolved: Yes Denies suicidal/homicidal ideation: Yes Issues/concerns per patient self-inventory: None Other: N/A  New problem(s) identified: Pt refusing to sign release for [facility] medical records   New Short Term/Long Term Goal(s): None identified at this time.   Discharge Plan or Barriers:   Reason for Continuation of Hospitalization: Disorganization Paranoia Medication stabilization   Estimated Length of Stay: 3-5 days  Attendees: Patient: 06/03/2016  9:59 AM   Physician: Ursula Alert, MD 06/03/2016  9:59 AM  Nursing: Hoy Register, RN 06/03/2016  9:59 AM  RN Care Manager: Lars Pinks, RN 06/03/2016  9:59 AM  Social Worker: Ripley Fraise 06/03/2016  9:59 AM  Recreational Therapist: Laretta Bolster  06/03/2016  9:59 AM  Other: Norberto Sorenson 06/03/2016  9:59 AM  Other:  06/03/2016  9:59 AM    Scribe for Treatment Team:  Roque Lias LCSW 06/03/2016 9:59 AM

## 2016-06-03 NOTE — Progress Notes (Signed)
[facility] Post 1:1 Observation Documentation  For the first (8) hours following discontinuation of 1:1 precautions, a progress note entry by nursing staff should be documented at least every 2 hours, reflecting the patient's behavior, condition, mood, and conversation.  Use the progress notes for additional entries.  Time 1:1 discontinued:  1000  Patient's Behavior:  Patient is calm cooperative and resting in bed.   Patient's Condition:  Patient remains free of falls and all seizure activitity  Patient's Conversation:  Patient asleep   Jerrye BushyLaRonica R Kristian Mogg 06/03/2016, 12:00PM

## 2016-06-03 NOTE — Progress Notes (Signed)
Nursing 1:1 note D:Pt observed sleeping in bed with eyes closed. RR even and unlabored. No distress noted. A: 1:1 observation continues for safety  R: pt remains safe  

## 2016-06-03 NOTE — Progress Notes (Signed)
[facility] Post 1:1 Observation Documentation  For the first (8) hours following discontinuation of 1:1 precautions, a progress note entry by nursing staff should be documented at least every 2 hours, reflecting the patient's behavior, condition, mood, and conversation.  Use the progress notes for additional entries.  Time 1:1 discontinued:  1000  Patient's Behavior:  Patient is calm cooperative and resting in bed.   Patient's Condition:  Patient remains free of falls and all seizure activitity  Patient's Conversation:  Patient asleep

## 2016-06-03 NOTE — Progress Notes (Signed)
[facility] Post 1:1 Observation Documentation  For the first (8) hours following discontinuation of 1:1 precautions, a progress note entry by nursing staff should be documented at least every 2 hours, reflecting the patient's behavior, condition, mood, and conversation.  Use the progress notes for additional entries.  Time 1:1 discontinued:  1000  Patient's Behavior:  Patient is calm cooperative and resting in bed.   Patient's Condition:  Patient remains free of falls and all seizure activitity  Patient's Conversation:  Patient eating dinner. Patient denies any safety issues.   Jerrye BushyLaRonica R Lexy Meininger
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
## 2016-06-04 NOTE — Progress Notes (Signed)
[facility] MD Progress Note  06/04/2016 5:20 PM Jesus Bryan  MRN:  [id number]  Subjective: Jesus Bryan reports, "I was going to [employer]. I'm doing alright. I stay in the [place], but [place] to [place]. I hear Jesus, Christ. I'm also God. I got my mom, dad & son"  Objective: Patient seen and chart reviewed. Discussed patient with treatment team. Will presents as disorganized, delusional, grandiose ,says he is God & hears Jesus Christ Pt when asked about his past and family hx - makes irrelevant statements like " he is from heaven." Pt gave consent to talk to his friend Jesus Bryan - attempted to call , not reachable . CSW is working on obtaining medical records from [facility]. Pt is alert , is taking his increased dose of Keppra,He continues to refuse depakote. Continue to encourage and support.  Principal Problem: Schizoaffective disorder (HCC) Diagnosis:   Patient Active Problem List   Diagnosis Date Noted  . Seizures (HCC) [R56.9] 05/30/2016  . Delusions (HCC) [F22]   . Schizoaffective disorder (HCC) [F25.9] 05/27/2016   Total Time spent with patient: 15 minutes  Past Psychiatric History: Patient was admitted at [facility] in the past - pt however not a good historian and is not willing to sign a consent for release of information from [facility].  Past Medical History:  Past Medical History:  Diagnosis Date  . Seizures (HCC)     Past Surgical History:  Procedure Laterality Date  . NO PAST SURGERIES     Family History: is disorganized , unable to comment , attempted to get collateral information from friend - not reachable. Family Psychiatric  History: same as above Social History:  History  Alcohol Use  . 4.2 oz/week  . 7 Glasses of wine per week    Comment: poor historian     History  Drug Use No    Social History   Social History  . Marital status: Single    Spouse name: N/A  . Number of children: N/A  . Years of education: N/A   Social History Main Topics  . Smoking status: Current Every Day  Smoker    Packs/day: 1.00    Years: 5.00  . Smokeless tobacco: Never Used     Comment: poor historian, could not give length of smoking history  . Alcohol use 4.2 oz/week    7 Glasses of wine per week     Comment: poor historian  . Drug use: No  . Sexual activity: No   Other Topics Concern  . None   Social History Narrative  . None   Additional Social History:   Sleep: Fair  Appetite:  Fair  Current Medications: Current Facility-Administered Medications  Medication Dose Route Frequency Provider Last Rate Last Dose  . acetaminophen (TYLENOL) tablet 650 mg  650 mg Oral Q6H PRN Oneta Rack, NP      . alum & mag hydroxide-simeth (MAALOX/MYLANTA) 200-200-20 MG/5ML suspension 30 mL  30 mL Oral Q4H PRN Oneta Rack, NP      . citalopram (CELEXA) tablet 20 mg  20 mg Oral Daily Jomarie Longs, MD   20 mg at 06/04/16 0839  . diphenhydrAMINE (BENADRYL) capsule 25 mg  25 mg Oral Q8H PRN Jomarie Longs, MD       Or  . diphenhydrAMINE (BENADRYL) injection 25 mg  25 mg Intramuscular Q6H PRN Saramma Eappen, MD      . divalproex (DEPAKOTE) DR tablet 250 mg  250 mg Oral Q8H Jomarie Longs, MD   250  mg at 06/01/16 2140  . feeding supplement (ENSURE ENLIVE) (ENSURE ENLIVE) liquid 237 mL  237 mL Oral BID BM Saramma Eappen, MD   237 mL at 06/04/16 1714  . haloperidol (HALDOL) tablet 5 mg  5 mg Oral Q8H PRN Jomarie LongsSaramma Eappen, MD       Or  . haloperidol lactate (HALDOL) injection 5 mg  5 mg Intramuscular Q8H PRN Saramma Eappen, MD      . levETIRAcetam (KEPPRA) tablet 1,000 mg  1,000 mg Oral BID Jomarie LongsSaramma Eappen, MD   1,000 mg at 06/04/16 1713  . LORazepam (ATIVAN) tablet 1 mg  1 mg Oral Q6H PRN Jomarie LongsSaramma Eappen, MD   1 mg at 06/03/16 0817   Or  . LORazepam (ATIVAN) injection 1 mg  1 mg Intramuscular Q6H PRN Jomarie LongsSaramma Eappen, MD   1 mg at 06/02/16 0751  . magnesium hydroxide (MILK OF MAGNESIA) suspension 30 mL  30 mL Oral Daily PRN Oneta Rackanika N Lewis, NP      . OLANZapine (ZYPREXA) tablet 20 mg  20 mg Oral  QHS Jomarie LongsSaramma Eappen, MD   20 mg at 06/03/16 2133  . traZODone (DESYREL) tablet 100 mg  100 mg Oral QHS Jackelyn PolingJason A Berry, NP   100 mg at 06/03/16 2133    Lab Results:  No results found for this or any previous visit (from the past 48 hour(s)).  Blood Alcohol level:  Lab Results  Component Value Date   ETH <5 05/26/2016    Metabolic Disorder Labs: Lab Results  Component Value Date   HGBA1C 5.0 05/31/2016   MPG 97 05/31/2016   Lab Results  Component Value Date   PROLACTIN 79.1 (H) 05/31/2016   Lab Results  Component Value Date   CHOL 161 05/31/2016   TRIG 145 05/31/2016   HDL 67 05/31/2016   CHOLHDL 2.4 05/31/2016   VLDL 29 05/31/2016   LDLCALC 65 05/31/2016    Physical Findings: AIMS: Facial and Oral Movements Muscles of Facial Expression: None, normal Lips and Perioral Area: None, normal Jaw: None, normal Tongue: None, normal,Extremity Movements Upper (arms, wrists, hands, fingers): None, normal Lower (legs, knees, ankles, toes): None, normal, Trunk Movements Neck, shoulders, hips: None, normal, Overall Severity Severity of abnormal movements (highest score from questions above): None, normal Incapacitation due to abnormal movements: None, normal Patient's awareness of abnormal movements (rate only patient's report): No Awareness, Dental Status Current problems with teeth and/or dentures?: No Does patient usually wear dentures?: No  CIWA:    COWS:     Musculoskeletal: Strength & Muscle Tone: within normal limits Gait & Station: normal Patient leans: N/A  Psychiatric Specialty Exam: Physical Exam  Nursing note and vitals reviewed.   Review of Systems  Psychiatric/Behavioral: Positive for hallucinations. The patient is nervous/anxious.   All other systems reviewed and are negative.   Blood pressure (!) 109/59, pulse 92, temperature 98.8 F (37.1 C), temperature source Oral, resp. rate 16, height 5\' 6"  (1.676 m), weight 53.5 kg (118 lb), SpO2 98 %.Body mass  index is 19.05 kg/m.  General Appearance: Disheveled, delusional  Eye Contact:  Fair  Speech:  Clear, irrelevant  Volume:  Normal  Mood:  Dysphoric  Affect:  Congruent  Thought Process:  Disorganized, Irrelevant and Descriptions of Associations: Tangential  Orientation:  Full (Time, Place, and Person)  Thought Content:  Delusions, Paranoid Ideation and Rumination  Suicidal Thoughts:  No  Homicidal Thoughts:  No  Memory:  Immediate;   Fair Recent;   Poor Remote;   Poor  Judgement:  Impaired  Insight:  Shallow  Psychomotor Activity:  Normal  Concentration:  Concentration: Fair and Attention Span: Fair  Recall:  [employer] of Knowledge:  Limited  Language:  Fair  Akathisia:  Negative  Handed:  Right  AIMS (if indicated):     Assets:  Desire for Improvement  ADL's:  Intact  Cognition:  Impaired,  Mild  Sleep:  Number of Hours: 6.75   Schizoaffective disorder (HCC) Unstable  Will continue today 06/04/16 plan as below except where it is noted.  Treatment Plan Summary:  Patient seen as grandiose, delusional, making irrelevant statements, has thought blocking, is a poor historian, attempted to obtain collateral information - unable to reach family.  06-04-16: Will continue treatment as recommended. No changes made..  Daily contact with patient to assess and evaluate symptoms and progress in treatment, Medication management and Plan see below  For schizoaffective disorder :  Will continue Zyprexa to 20 mg po qhs.  For insomnia:  Will continue Trazodone 50 mg po qhs prn.  For anxiety/agitation: Will continue PRN medications as per unit protocol.  For seizure do:   Will Keppra to 1000 mg po bid as per neurology consult per EDP. Depakote DR 250 mg po tid. Staff indicated that patient continue to refuse medications from time to time.  Will discontinue 1:1 precaution for safety reasons.Will reassess.  CT scan brain - reviewed - no acute abnormalities.  Reviewed - tsh- wnl  , lipid panel - wnl, Hba1c- 5.0  CSW will continue to work on obtaining medical records and collateral information.  Sanjuana Kava, NP, PMHNP, FNP-BC 06/04/2016, 5:20 PMPatient ID: Jesus Bryan, male   DOB: 1990-08-04, 26 y.o.   MRN: [id number]

## 2016-06-04 NOTE — Progress Notes (Signed)
Adult Psychoeducational Group Note  Date:  06/04/2016 Time:  9:35 PM  Group Topic/Focus:  Wrap-Up Group:   The focus of this group is to help patients review their daily goal of treatment and discuss progress on daily workbooks.  Participation Level:  Active  Participation Quality:  Appropriate  Affect:  Appropriate  Cognitive:  Appropriate  Insight: Appropriate  Engagement in Group:  Engaged  Modes of Intervention:  Discussion  Additional Comments: The patient expressed that he rates today a 9.The patient also said that he enjoyed the group on self-sabatage.  Octavio Mannshigpen, Delita Chiquito Lee 06/04/2016, 9:35 PM

## 2016-06-04 NOTE — Progress Notes (Signed)
Patient denies SI, HI and AVH.  Patient states there is nothing wrong with him and denies needing medications for anything though he is willing to take some of his prescribed medications.  Patient is isolative and does not engage in talks.   Assess patient for safety, offer medications as prescribed, engage patient in 1:1 staff talks,   Continue to monitor as planned. Patient able to contract for safety.

## 2016-06-04 NOTE — [facility] Group Notes (Addendum)
Adult Group Therapy Note (Social Work)  Date:  06/04/2016  Time:  11:30AM-12:00PM   Group Topic/Focus:  Today's process group focused on the topic of Self Sabotage, what this is, and what methods of self-sabotage patients in the group have found themselves using.  Commonalities were then pointed out and Motivational Interviewing was utilized to explore possible benefits of choosing healthier coping skills.  Participation Level:  Minimal  Participation Quality:  Attentive  Affect:  Blunted  Cognitive:  Delusional  Insight: Limited  Engagement in Group:  Poor  Modes of Intervention:  Discussion and Motivational Interviewing  Additional Comments:  The patient expressed that he is DTE Energy CompanyJesus Christ and loves to show love to people.  After that, he did not speak in group but sat quietly seeming to be elsewhere.  Ambrose MantleMareida Grossman-Orr, LCSW 06/04/2016, 12:44 PM

## 2016-06-04 NOTE — Progress Notes (Signed)
D: Pt at the time of assessment was alert and oriented x4. Pt at the time denied depression, anxiety, pain, SI, HI or AVH. Pt however was flat isolative and withdrawn to room; remained in bed with eyes closed; states, "I feel good; all those things about me feels good; My seizure is fine; I can handle it now."  Pt was unwilling to participate and irritable.  A: Medications offered as prescribed. All patient's questions and concerns were addressed. Support, encouragement, and safe environment provided. 15-minute safety checks continue.  R: Pt was med compliant.  Did not attend group. Safety checks continue.

## 2016-06-05 MED ORDER — LORAZEPAM 2 MG/ML IJ SOLN
INTRAMUSCULAR | Status: AC
  Filled 2016-06-05: qty 1

## 2016-06-05 MED ORDER — LORAZEPAM 2 MG/ML IJ SOLN
2.0000 mg | Freq: Once | INTRAMUSCULAR | Status: AC
  Administered 2016-06-05: 2 mg via INTRAMUSCULAR

## 2016-06-05 NOTE — Progress Notes (Signed)
1: 1 Note   Pt at this time is in bed resting with eyes closed. Pt denied depression, anxiety, pain, SI, HI or AVH. Support, encouragement, and safe environment provided. 1:1 staff is present with Pt at this time. 1:1 monitoring continues for Pt's safety. 15-minute safety checks also continues.

## 2016-06-05 NOTE — Progress Notes (Signed)
[facility]  06/05/2016 12:53 PM Vedia CofferWilly Esau  MRN:  [id number]  Subjective: Huel CoventryWilly reports, "I'm feeling alright".  Objective: Patient seen and chart reviewed. Discussed patient with treatment team. Huel CoventryWilly presents today, calm, verbally responsive with good eye contact. There are no hyper-religious statements made during this follow-up care assessment. Huel CoventryWilly is sitting on his bed putting lotion to his arm. He denies any complaints. He did not make a lot of statements. He presents as polite & quiet today. CSW continue working [facility] Staff reports that patient continues to refuse some of his medications. He currently appears to be in no apparent distress. He continue to need encourage and support.  Principal Problem: Schizoaffective disorder (HCC) Diagnosis:   Patient Active Problem List   Diagnosis Date Noted  . Seizures (HCC) [R56.9] 05/30/2016  . Delusions (HCC) [F22]   . Schizoaffective disorder (HCC) [F25.9] 05/27/2016   Total Time spent with patient: 15 minutes  Past Psychiatric History: [facility] - Patient however, is not a good historian and is not willing to sign a consent [facility]  Past Medical History:  Past Medical History:  Diagnosis Date  . Seizures (HCC)     Past Surgical History:  Procedure Laterality Date  . NO PAST SURGERIES     Family History: is disorganized , unable to comment, attempted to get collateral information from friend - not reachable.  Family Psychiatric  History: same as above  Social History:  History  Alcohol Use  . 4.2 oz/week  . 7 Glasses of wine per week    Comment: poor historian     History  Drug Use No    Social History   Social History  . Marital status: Single    Spouse name: N/A  . Number of children: N/A  . Years of education: N/A   Social History Main Topics  . Smoking status: Current Every Day Smoker    Packs/day: 1.00     Years: 5.00  . Smokeless tobacco: Never Used     Comment: poor historian, could not give length of smoking history  . Alcohol use 4.2 oz/week    7 Glasses of wine per week     Comment: poor historian  . Drug use: No  . Sexual activity: No   Other Topics Concern  . None   Social History Narrative  . None   Additional Social History:   Sleep: Fair  Appetite:  Fair  Current Medications: Current Facility-Administered Medications  Medication Dose Route Frequency Provider Last Rate Last Dose  . acetaminophen (TYLENOL) tablet 650 mg  650 mg Oral Q6H PRN Oneta Rackanika N Lewis, NP      . alum & mag hydroxide-simeth (MAALOX/MYLANTA) 200-200-20 MG/5ML suspension 30 mL  30 mL Oral Q4H PRN Oneta Rackanika N Lewis, NP      . citalopram (CELEXA) tablet 20 mg  20 mg Oral Daily Jomarie LongsSaramma Eappen, MD   20 mg at 06/05/16 [id number]  . diphenhydrAMINE (BENADRYL) capsule 25 mg  25 mg Oral Q8H PRN Jomarie LongsSaramma Eappen, MD       Or  . diphenhydrAMINE (BENADRYL) injection 25 mg  25 mg Intramuscular Q6H PRN Saramma Eappen, MD      . divalproex (DEPAKOTE) DR tablet 250 mg  250 mg Oral Q8H Saramma Eappen, MD   250 mg at 06/01/16 2140  . feeding supplement (ENSURE ENLIVE) (ENSURE ENLIVE) liquid 237 mL  237 mL Oral  BID BM Jomarie Longs, MD   237 mL at 06/04/16 1714  . haloperidol (HALDOL) tablet 5 mg  5 mg Oral Q8H PRN Jomarie Longs, MD       Or  . haloperidol lactate (HALDOL) injection 5 mg  5 mg Intramuscular Q8H PRN Jomarie Longs, MD      . levETIRAcetam (KEPPRA) tablet 1,000 mg  1,000 mg Oral BID Jomarie Longs, MD   1,000 mg at 06/05/16 0833  . LORazepam (ATIVAN) tablet 1 mg  1 mg Oral Q6H PRN Jomarie Longs, MD   1 mg at 06/03/16 0817   Or  . LORazepam (ATIVAN) injection 1 mg  1 mg Intramuscular Q6H PRN Jomarie Longs, MD   1 mg at 06/02/16 0751  . magnesium hydroxide (MILK OF MAGNESIA) suspension 30 mL  30 mL Oral Daily PRN Oneta Rack, NP      . OLANZapine (ZYPREXA) tablet 20 mg  20 mg Oral QHS Jomarie Longs, MD   20 mg  at 06/04/16 2120  . traZODone (DESYREL) tablet 100 mg  100 mg Oral QHS Jackelyn Poling, NP   100 mg at 06/04/16 2121    Lab Results:  No results found for this or any previous visit (from the past 48 hour(s)).  Blood Alcohol level:  Lab Results  Component Value Date   ETH <5 05/26/2016   Metabolic Disorder Labs: Lab Results  Component Value Date   HGBA1C 5.0 05/31/2016   MPG 97 05/31/2016   Lab Results  Component Value Date   PROLACTIN 79.1 (H) 05/31/2016   Lab Results  Component Value Date   CHOL 161 05/31/2016   TRIG 145 05/31/2016   HDL 67 05/31/2016   CHOLHDL 2.4 05/31/2016   VLDL 29 05/31/2016   LDLCALC 65 05/31/2016    Physical Findings: AIMS: Facial and Oral Movements Muscles of Facial Expression: None, normal Lips and Perioral Area: None, normal Jaw: None, normal Tongue: None, normal,Extremity Movements Upper (arms, wrists, hands, fingers): None, normal Lower (legs, knees, ankles, toes): None, normal, Trunk Movements Neck, shoulders, hips: None, normal, Overall Severity Severity of abnormal movements (highest score from questions above): None, normal Incapacitation due to abnormal movements: None, normal Patient's awareness of abnormal movements (rate only patient's report): No Awareness, Dental Status Current problems with teeth and/or dentures?: No Does patient usually wear dentures?: No  CIWA:    COWS:     Musculoskeletal: Strength & Muscle Tone: within normal limits Gait & Station: normal Patient leans: N/A  Psychiatric Specialty Exam: Physical Exam  Vitals reviewed. Cardiovascular: Normal rate.     Review of Systems  Psychiatric/Behavioral: Positive for hallucinations. Negative for depression. The patient is nervous/anxious.   All other systems reviewed and are negative.   Blood pressure 105/70, pulse (!) 102, temperature 98.6 F (37 C), resp. rate 16, height 5\' 6"  (1.676 m), weight 53.5 kg (118 lb), SpO2 98 %.Body mass index is 19.05 kg/m.   General Appearance: Disheveled, delusional  Eye Contact:  Fair  Speech:  Clear, irrelevant  Volume:  Normal  Mood:  Dysphoric  Affect:  Congruent  Thought Process:  Disorganized, Irrelevant and Descriptions of Associations: Tangential  Orientation:  Full (Time, Place, and Person)  Thought Content:  Delusions, Paranoid Ideation and Rumination  Suicidal Thoughts:  No  Homicidal Thoughts:  No  Memory:  Immediate;   Fair Recent;   Poor Remote;   Poor  Judgement:  Impaired  Insight:  Shallow  Psychomotor Activity:  Normal  Concentration:  Concentration: Fair  and Attention Span: Fair  Recall:  [employer] of Knowledge:  Limited  Language:  Fair  Akathisia:  Negative  Handed:  Right  AIMS (if indicated):     Assets:  Desire for Improvement  ADL's:  Intact  Cognition:  Impaired,  Mild  Sleep:  Number of Hours: 6.75   Schizoaffective disorder (HCC) Unstable  Will continue today 06/05/16 plan as below except where it is noted.  Treatment Plan Summary:  Patient seen as as quiet today, less delusional/making irrelevant statements. Has thought blocking, is a poor historian, attempted to obtain collateral information - unable to reach family.  06-05-16: Will continue treatment as recommended. No changes made.  Daily contact with patient to assess and evaluate symptoms and progress in treatment, Medication management and Plan see below  For schizoaffective disorder :  Will continue Zyprexa to 20 mg po qhs.  For insomnia:  Will continue Trazodone 50 mg po qhs prn.  For anxiety/agitation: Will continue PRN medications as per unit protocol.  For seizure do:   Will continue Keppra to 1000 mg po bid as per neurology consult per EDP. Depakote DR 250 mg po tid. Staff indicated that patient continue to refuse medications from time to time.  Will discontinue 1:1 precaution for safety reasons.Will reassess.  CT scan brain - reviewed - no acute abnormalities.  Reviewed - tsh- wnl ,  lipid panel - wnl, Hba1c- 5.0  CSW will continue to work on obtaining medical records and collateral information.   Sanjuana Kava, NP, PMHNP, FNP-BC 06/05/2016, 12:53 PMPatient ID: [name], male   DOB: 1991-03-25, 26 y.o.   MRN: [id number]

## 2016-06-05 NOTE — Progress Notes (Addendum)
Called to assist with this patient as an alert from [facility] while patient in the dining hall lining up in cafeteria. Patient appeared confused, slightly agitated, but stated to [facility] he felt dizzy. When staff obtained wheelchair patient then noted to jerk slightly, but kept saying '' I'm fine I'm fine.''when Clinical research associatewriter and staff attempted to redirect pt clenching fists at Emerson Electricwriter  He would not get into wheelchair until show of support arrived, with assistance. He continued to appear confused, disoriented, restless, moving limbs, trying to take off shirt, then jerk arm when trying to get vital signs. Prn ativan given by primary RN for seizures, ativan 1mg  po. Please see MAR.  Patient however, continued to appear disoriented, continued with slight jerking movements, so this writer obtained Elta GuadeloupeLaurie Parks NP , as well as Consulting civil engineerCharge RN, Primary RN to evaluate. Concern for pending seizure like activity. Pt unable to answer sensorium questions, holding head in hands. He would not respond verbally to any questions, staring.  Order received for additional ativan 2 mg IM STAT. Patient continued to attempt to get out of bed but was unsteady, unable to answer sensorium questions, staring. Pt received ativan IM with staff support. [facility]. Pt requires 1.1. For safety at this time, order received from Elta GuadeloupeLaurie Parks NP. Pt remains with 1.1. For safety. Remains in the care of primary RN Erskine SquibbJane. Pt is safe.

## 2016-06-05 NOTE — Progress Notes (Signed)
Pt is calm at this time in bed, still having some jerking activity. Pt ate his dinner his and drunk his fluids. Staff remain in the room with pt, will continue to monitor.

## 2016-06-05 NOTE — [facility] Group Notes (Signed)
[facility] Group Notes: (Clinical Social Work)   06/05/2016      Type of Therapy:  Group Therapy   Participation Level:  Did Not Attend despite [facility] prompting   Yehia Mcbain Grossman-Orr, LCSW 06/05/2016, 1:26 PM     

## 2016-06-05 NOTE — Progress Notes (Signed)
DAR NOTE: Patient presents with anxious affect and depressed mood. Pt has been in the room sleeping most of the day. Pt also appeared confused and hard follow through communication. Denies pain, auditory and visual hallucinations.  Rates depression at 0, hopelessness at 0, and anxiety at 0.  Maintained on routine safety checks.  Medications given as prescribed.  Support and encouragement offered as needed. Patient observed socializing with peers in the dayroom.  Offered no complaint.

## 2016-06-05 NOTE — [facility] Counselor (Signed)
CSW attempted 06/04/16 and 06/05/16 to do Psychosocial Assessment with patient, but he was still delusional, insisting "I'm Jesus Christ."  Efforts will continue.  Ambrose MantleMareida Grossman-Orr, LCSW 06/05/2016, 4:48 PM

## 2016-06-05 NOTE — Progress Notes (Signed)
Adult Psychoeducational Group Note  Date:  06/05/2016 Time:  9:05 PM  Group Topic/Focus:  Wrap-Up Group:   The focus of this group is to help patients review their daily goal of treatment and discuss progress on daily workbooks.  Participation Level:  Did Not Attend  Participation Quality:  Did not attend  Affect:  Did not attend  Cognitive:  Did not attend  Insight: None  Engagement in Group:  Did not attend  Modes of Intervention:  Did not attend  Additional Comments:  Patient did not attend wrap up group this evening.  Alejandria Wessells L Anastasija Anfinson 06/05/2016, 9:05 PM

## 2016-06-05 NOTE — Progress Notes (Signed)
Patient ID: Jesus Bryan, male   DOB: [birth year], 26 y.o.   MRN: [id number]
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
## 2016-06-06 ENCOUNTER — Inpatient Hospital Stay (HOSPITAL_COMMUNITY)
Admission: AD | Admit: 2016-06-06 | Discharge: 2016-06-06 | Disposition: A | Discharge status: Home / Self Care | Payer: Medicaid Other | Attending: Nurse Practitioner | Admitting: Nurse Practitioner

## 2016-06-06 DIAGNOSIS — G9340 Encephalopathy, unspecified: Secondary | ICD-10-CM

## 2016-06-06 MED ORDER — LEVETIRACETAM 750 MG PO TABS
1500.0000 mg | ORAL_TABLET | Freq: Two times a day (BID) | ORAL | Status: DC
  Administered 2016-06-06 – 2016-06-15 (×18): 1500 mg via ORAL
  Filled 2016-06-06 (×22): qty 2

## 2016-06-06 MED ORDER — OLANZAPINE 7.5 MG PO TABS
22.5000 mg | ORAL_TABLET | Freq: Every day | ORAL | Status: DC
  Administered 2016-06-06 – 2016-06-09 (×4): 22.5 mg via ORAL
  Filled 2016-06-06 (×7): qty 3

## 2016-06-06 NOTE — Progress Notes (Signed)
Adult Psychoeducational Group Note  Date:  06/06/2016 Time:  9:34 PM  Group Topic/Focus:  Wrap-Up Group:   The focus of this group is to help patients review their daily goal of treatment and discuss progress on daily workbooks.  Participation Level:  Did Not Attend  Participation Quality:  Did not attend  Affect:  Did not attend  Cognitive:  Did not attend  Insight: None  Engagement in Group:  Did not attend  Modes of Intervention:  Did not attend  Additional Comments:  Patient did not attend wrap up group this evening.   Nili Honda L Emmilia Sowder 06/06/2016, 9:34 PM

## 2016-06-06 NOTE — [facility] Group Notes (Signed)
[facility] Group Notes:  (Counselor/Nursing/MHT/Case Management/Adjunct)  06/06/2016 1:15PM  Type of Therapy:  Group Therapy  Participation Level:  Active  Participation Quality:  Appropriate  Affect:  Flat  Cognitive:  Oriented  Insight:  Improving  Engagement in Group:  Limited  Engagement in Therapy:  Limited  Modes of Intervention:  Discussion, Exploration and Socialization  Summary of Progress/Problems: The topic for group was balance in life.  Pt participated in the discussion about when their life was in balance and out of balance and how this feels.  Pt discussed ways to get back in balance and short term goals they can work on to get where they want to be.  Invited.  Chose to not attend.  Jesus Bryan, Jesus Bryan 06/06/2016 1:44 PM

## 2016-06-06 NOTE — Progress Notes (Signed)
1:1 Note: Pt at this time is in bed resting with eyes closed. Pt does not look to be in any distress at this time. 1:1 staff is present in room with Pt at this time. 1:1 monitoring continues for Pt's safety. 15-minute safety checks also continues at this time. 

## 2016-06-06 NOTE — Progress Notes (Signed)
Spoke with Adaline Sill. Smith PA Neurology.  Discussed patient's mini seizures [facility].  Recommendation Keppra increased to 1500 mg BID (from 1000 mg BID).  Patient continues to refuse Depakote, it was reported that patient does not believe that Depakote was effective.  Furthermore, patient is disorganized and not able to clarify the rationale of non compliance of Depakote.  EEG and Keppra increased dose ordered in EPIC.

## 2016-06-06 NOTE — Progress Notes (Signed)
Recreation Therapy Notes  Date: 06/06/16 Time: 1000 Location: 500 Hall Dayroom  Group Topic: Coping Skills  Goal Area(s) Addresses:  Patient will be able to identify positive coping skills. Patient will be able to identify the benefits of positive coping skills. Patient will be able to identify benefits of using coping skills post d/c.  Intervention: Pencils, blank web   Activity: Web Design.  Patients were given an blank web worksheet.  Patients were to identify the things that have them "stuck" and write them inside of the web.  Patients were to then identify coping skills to help them deal with the situations they are faced with.   Education: Coping Skills, Discharge Planning.   Education Outcome: Acknowledges understanding/In group clarification offered/Needs additional education.   Clinical Observations/Feedback: Pt did not attend group.   Jubilee Vivero, LRT/CTRS         Dakai Braithwaite A 06/06/2016 12:39 PM 

## 2016-06-06 NOTE — Progress Notes (Signed)
Patient refused his Celexa; patient states " I have not been taking this"; patient did take his increased dose of Keppra; Dr. Elna BreslowEappen made aware

## 2016-06-06 NOTE — Progress Notes (Signed)
EEG completed; results pending.    

## 2016-06-06 NOTE — Progress Notes (Addendum)
RN 1:1 NOTE  D: Patient denies SI/HI and A/V hallucinations  A: Continue 1:1 observation; patient encouraged about taking medication  R: Patient refused his Depakote and reports that it makes him feel sick; patient did take his Keppra; patient came out of his room and engaged minimally; patient is pleasant but he does not attend groups

## 2016-06-06 NOTE — Plan of Care (Signed)
Problem: Safety: Goal: Periods of time without injury will increase Outcome: Progressing Pt safe on the unit at this time, 1:1 continues for pt safety

## 2016-06-06 NOTE — Progress Notes (Signed)
Nursing 1:1 note D:Pt observed sleeping in bed with eyes closed. RR even and unlabored. No distress noted. A: 1:1 observation continues for safety  R: pt remains safe  

## 2016-06-06 NOTE — Progress Notes (Signed)
RN 1:1 NOTE  Patient is currently off the unit getting an EEG performed

## 2016-06-06 NOTE — Progress Notes (Signed)
Pt continued to refuse his Depakote, pt says that his is ok
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
## 2016-06-06 NOTE — Progress Notes (Signed)
RN 1:1 NOTE  D: Patient denies SI/HI and A/V hallucinations;  A: Continue 1:1 observation; patient encouraged to attend groups; patient educated about medications; patient given medications per physician orders; patient encouraged to express feelings and/or concerns  R: Patient denies all but patient observed responding to internal stimuli; patient when asked questions responds pleasantly; patient reports that [place] came [place] and [place] and came back; patient has bizarre actions;

## 2016-06-06 NOTE — Procedures (Signed)
HPI:  26 year old with mental status change  TECHNICAL SUMMARY:  A multichannel referential and bipolar montage EEG using the standard international 10-20 system was performed on the patient described as awake and briefly drowsy.  The dominant background activity consists of 7.5-8.5 hertz activity seen most prominantly over the posterior head region.  The backgound activity is minimally reactive to eye opening and closing procedures.  Low voltage fast (beta) activity is distributed symmetrically and maximally over the anterior head regions.  ACTIVATION:  Stepwise photic stimulation at 4-20 flashes per second was performed and did not elicit any abnormal waveforms.  Hyperventilation was performed for 3 minutes with good patient effort and produced slowing of the background activity, consistent with a normal hyperventilation response.  This quickly resolved after hyperventilation was completed.  EPILEPTIFORM ACTIVITY:  There were no spikes, sharp waves or paroxysmal activity.  SLEEP:  Physiologic drowsiness is noted, but no stage II sleep.  CARDIAC:  The EKG lead revealed a regular rhythm.  IMPRESSION:  This is an abnormal EEG demonstrating a mild diffuse slowing of electrocerebral activity.  This can be seen in a wide variety of encephalopathic state including those of a toxic, metabolic, or degenerative nature.  There were no focal, hemispheric, or lateralizing features.  No epileptiform activity was recorded.  This does not exclude the diagnosis of seizure disorder and if seizure remains high and on the list of differential diagnosis, an ambulatory EEG could be of value.  Correlate clinically.

## 2016-06-07 ENCOUNTER — Encounter: Payer: Self-pay | Admitting: Neurology

## 2016-06-07 NOTE — Progress Notes (Signed)
Adult Psychoeducational Group Note  Date:  06/07/2016 Time:  8:40 PM  Group Topic/Focus:  Wrap-Up Group:   The focus of this group is to help patients review their daily goal of treatment and discuss progress on daily workbooks.  Participation Level:  Active  Participation Quality:  Appropriate  Affect:  Appropriate  Cognitive:  Alert  Insight: Appropriate  Engagement in Group:  Engaged  Modes of Intervention:  Discussion  Additional Comments:  Patient expressed that he had a good day.   Fonda Rochon L Stephens [place] 06/07/2016, 8:40 PM

## 2016-06-07 NOTE — Progress Notes (Signed)
1:1 Note: Patient maintained on constant supervision for safety.  Patient is alert and oriented to person, place and time.  Patient is ambulatory on the unit without difficulty.  No seizure activity noted.  Medication given as prescribed.  Patient offered support and encouragement as needed.  Patient preoccupied with discharge.  Patient safe on the unit with supervision.

## 2016-06-07 NOTE — Progress Notes (Signed)
1:1 Note: Patient maintained on constant supervision for safety.  No seizure activity noted.  Patient is alert and oriented to person, place and time.  Patient in the dayroom watching TV.  Minimal interaction with staff.  Medication given as prescribed.  Routine safety checks continues.  Patient is safe on the unit with supervision.

## 2016-06-07 NOTE — Progress Notes (Signed)
Nursing 1:1 note D:Pt observed sleeping in bed with eyes closed. RR even and unlabored. No distress noted. A: 1:1 observation continues for safety  R: pt remains safe  

## 2016-06-07 NOTE — [facility] Group Notes (Signed)
[facility] LCSW Group Therapy  06/07/2016 1:15 pm  Type of Therapy: Process Group Therapy  Participation Level:  Active  Participation Quality:  Appropriate  Affect:  Flat  Cognitive:  Oriented  Insight:  Improving  Engagement in Group:  Limited  Engagement in Therapy:  Limited  Modes of Intervention:  Activity, Clarification, Education, Problem-solving and Support  Summary of Progress/Problems: Today's group addressed the issue of overcoming obstacles.  Patients were asked to identify their biggest obstacle post d/c that stands in the way of their on-going success, and then problem solve as to how to manage this. Invited.  Chose to not attend.  Jesus Bryan, Jesus Bryan 06/07/2016   2:18 PM

## 2016-06-07 NOTE — Progress Notes (Signed)
1:1 Note: Patient is maintained on constant supervision for safety.  Patient is alert and oriented to person, place and time.  Patient is up in the dayroom with minimal interaction with staff.  Medication given as prescribed.  Routine safety checks continues.  Patient is safe with supervision.  No seizure activity noted.  Patient refused Depakote after several encouragements.  Patient reports it makes him feel sick.  MD made aware.

## 2016-06-07 NOTE — Progress Notes (Signed)
Recreation Therapy Notes  Date: 06/07/16 Time: 1000 Location: 500 Hall Dayroom  Group Topic: Leisure Education  Goal Area(s) Addresses:  Patient will identify positive leisure activities.  Patient will identify one positive benefit of participation in leisure activities.   Intervention: Various activities, dry erase board, dry erase marker, eraser  Activity: Leisure Pictionary.  Patients were to pull a slip of paper with a leisure activity on from a can.  The patient is to then draw the picture on the board and the remaining patients are to guess what the picture is.  The person guesses correctly, will then get the opportunity to draw another activity on the board.  Education:  Leisure Education, Building control surveyorDischarge Planning  Education Outcome: Acknowledges education/In group clarification offered/Needs additional education  Clinical Observations/Feedback:  Pt did not attend group.   Caroll RancherMarjette Tanganika Barradas, LRT/CTRS         Caroll RancherLindsay, Sumeya Yontz A 06/07/2016 12:08 PM
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
## 2016-06-07 NOTE — Progress Notes (Signed)
Recreation Therapy Notes  Animal-Assisted Activity (AAA) Program Checklist/Progress Notes Patient Eligibility Criteria Checklist & Daily Group note for Rec Tx Intervention  Date: 03.06.2018 Time: 2:50pm [address]    AAA/T Program Assumption of Risk Form signed by Patient/ or Parent Legal Guardian Yes  Patient is free of allergies or sever asthma Yes  Patient reports no fear of animals Yes  Patient reports no history of cruelty to animals Yes  Patient understands his/her participation is voluntary Yes  Patient washes hands before animal contact Yes  Patient washes hands after animal contact Yes  Behavioral Response: Appropriate    Education: Hand Washing, Appropriate Animal Interaction   Education Outcome: Acknowledges education.   Clinical Observations/Feedback: Patient discussed with MD for appropriateness in pet therapy session. Both LRT and MD agree patient is appropriate for participation. Patient offered participation in session and signed necessary consent form without issue. Patient attended session, interacting appropriately with therapy dog and peers in session. Patient remained in session for approximately 20 minutes before requesting to return to locked 500 hall. LRT honored patient request.   Jesus Klinefelterenise Bryan Delmos Bryan, LRT/CTRS        Jesus Bryan, Jesus Bryan 06/07/2016 3:16 PM
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
## 2016-06-08 NOTE — [facility] Group Notes (Signed)
[facility] LCSW Group Therapy  06/08/2016 3:25 PM   Type of Therapy:  Group Therapy  Participation Level:  Active  Participation Quality:  Attentive  Affect:  Appropriate  Cognitive:  Appropriate  Insight:  Improving  Engagement in Therapy:  Engaged  Modes of Intervention:  Clarification, Education, Exploration and Socialization  Summary of Progress/Problems: Today's group focused on resilience.  Jesus Bryan stayed for most of the group, was engaged throughout.  No significant contributions-unable to identify a time he has been resilient-but thanked everyone for helping and supporting him-appeared to be genuine in his statements. Jesus Geraldorth, Jesus Bryan B 06/08/2016 , 3:25 PM

## 2016-06-08 NOTE — Progress Notes (Signed)
[facility] Post 1:1 Observation Documentation  For the first (8) hours following discontinuation of 1:1 precautions, a progress note entry by nursing staff should be documented at least every 2 hours, reflecting the patient's behavior, condition, mood, and conversation.  Use the progress notes for additional entries.  Time 1:1 discontinued:  1400 Patient's Behavior:  Calm and cooperative  Patient's Condition: No seizure reported or observed, pt is safe.  Patient's Conversation:  Pt would like to know when he is getting discharged.  Bethann PunchesJane O Anamarie Hunn 06/08/2016, 4:34 PM

## 2016-06-08 NOTE — Tx Team (Signed)
Interdisciplinary Treatment and Diagnostic Plan Update  06/08/2016 Time of Session: 12:36 PM  Jesus Bryan MRN: [id number]  Principal Diagnosis: Schizoaffective disorder, bipolar type [facility])  Secondary Diagnoses: Principal Problem:   Schizoaffective disorder, bipolar type (HCC) Active Problems:   Delusions (HCC)   Seizures (HCC)   Current Medications:  Current Facility-Administered Medications  Medication Dose Route Frequency Provider Last Rate Last Dose  . acetaminophen (TYLENOL) tablet 650 mg  650 mg Oral Q6H PRN Oneta Rack, NP      . alum & mag hydroxide-simeth (MAALOX/MYLANTA) 200-200-20 MG/5ML suspension 30 mL  30 mL Oral Q4H PRN Oneta Rack, NP      . diphenhydrAMINE (BENADRYL) capsule 25 mg  25 mg Oral Q8H PRN Jomarie Longs, MD       Or  . diphenhydrAMINE (BENADRYL) injection 25 mg  25 mg Intramuscular Q6H PRN Saramma Eappen, MD      . feeding supplement (ENSURE ENLIVE) (ENSURE ENLIVE) liquid 237 mL  237 mL Oral BID BM Saramma Eappen, MD   237 mL at 06/07/16 1639  . haloperidol (HALDOL) tablet 5 mg  5 mg Oral Q8H PRN Jomarie Longs, MD       Or  . haloperidol lactate (HALDOL) injection 5 mg  5 mg Intramuscular Q8H PRN Jomarie Longs, MD      . levETIRAcetam (KEPPRA) tablet 1,500 mg  1,500 mg Oral BID Adonis Brook, NP   1,500 mg at 06/08/16 0759  . LORazepam (ATIVAN) tablet 1 mg  1 mg Oral Q6H PRN Jomarie Longs, MD   1 mg at 06/07/16 0818   Or  . LORazepam (ATIVAN) injection 1 mg  1 mg Intramuscular Q6H PRN Jomarie Longs, MD   1 mg at 06/02/16 0751  . magnesium hydroxide (MILK OF MAGNESIA) suspension 30 mL  30 mL Oral Daily PRN Oneta Rack, NP      . OLANZapine (ZYPREXA) tablet 22.5 mg  22.5 mg Oral QHS Jomarie Longs, MD   22.5 mg at 06/07/16 2103  . traZODone (DESYREL) tablet 100 mg  100 mg Oral QHS Jackelyn Poling, NP   100 mg at 06/07/16 2103    PTA Medications: Prescriptions Prior to Admission  Medication Sig Dispense Refill Last Dose  . divalproex  (DEPAKOTE) 500 MG DR tablet Take 750 mg by mouth at bedtime.     . ferrous sulfate 325 (65 FE) MG tablet Take 325 mg by mouth daily with breakfast.     . levETIRAcetam (KEPPRA) 500 MG tablet Take 500 mg by mouth 3 (three) times daily.     Marland Kitchen LORazepam (ATIVAN) 1 MG tablet Take 1 mg by mouth 2 (two) times daily.     Marland Kitchen OLANZapine (ZYPREXA) 20 MG tablet Take 20 mg by mouth at bedtime.       Treatment Modalities: Medication Management, Group therapy, Case management,  1 to 1 session with clinician, Psychoeducation, Recreational therapy.   Physician Treatment Plan for Primary Diagnosis: Schizoaffective disorder, bipolar type (HCC) Long Term Goal(s): Improvement in symptoms so as ready for discharge  Short Term Goals: Ability to verbalize feelings will improve Ability to disclose and discuss suicidal ideas Ability to demonstrate self-control will improve Compliance with prescribed medications will improve Ability to identify changes in lifestyle to reduce recurrence of condition will improve Ability to maintain clinical measurements within normal limits will improve Compliance with prescribed medications will improve Ability to identify triggers associated with substance abuse/mental health issues will improve  Medication Management: Evaluate patient's response, side effects,  and tolerance of medication regimen.  Therapeutic Interventions: 1 to 1 sessions, Unit Group sessions and Medication administration.  Evaluation of Outcomes: Progressing  Physician Treatment Plan for Secondary Diagnosis: Principal Problem:   Schizoaffective disorder, bipolar type (HCC) Active Problems:   Delusions (HCC)   Seizures (HCC)   Long Term Goal(s): Improvement in symptoms so as ready for discharge  Short Term Goals: Ability to verbalize feelings will improve Ability to disclose and discuss suicidal ideas Ability to demonstrate self-control will improve Compliance with prescribed medications will  improve Ability to identify changes in lifestyle to reduce recurrence of condition will improve Ability to maintain clinical measurements within normal limits will improve Compliance with prescribed medications will improve Ability to identify triggers associated with substance abuse/mental health issues will improve  Medication Management: Evaluate patient's response, side effects, and tolerance of medication regimen.  Therapeutic Interventions: 1 to 1 sessions, Unit Group sessions and Medication administration.  Evaluation of Outcomes: Progressing   3/2:  :Patient seen as grandiose , delusional, making irrelevant statements , has thought blocking , is a limited historian , attempted to obtain collateral information - unable to reach family.  For schizoaffective do : Increase Zyprexa to 20 mg po qhs.  For insomnia: Trazodone 50 mg po qhs prn.  For anxiety/agitation: PRN medications as per unit protocol.  For seizure do:  Increase Keppra to 1000 mg po bid as per neurology consult per EDP.                           Depakote DR 250 mg po tid . Pt refusing depakote.  Will discontinue 1:1 precaution for safety reasons.Will reassess.  CT scan brain - reviewed - no acute abnormalities.  3/7: Will increase Zyprexa to 22.5 mg po qhs.. Dose increased cautiously due to his hx of seizures , triggered also [facility]  For insomnia: reviewed as below Will continue Trazodone 100 mg po qhs .  For anxiety/agitation: Will continue PRN medications as per unit protocol.  For seizure do: EEG completed - pending result. Will continue Keppra  1500 mg po bid as per neurology reconsult.Ordered Keppra level-pending. Please also see notes in EHR per NP. Discontinue 1:1 precaution.   RN Treatment Plan for Primary Diagnosis: Schizoaffective disorder, bipolar type (HCC) Long Term Goal(s): Knowledge of disease and therapeutic regimen to maintain health  will improve  Short Term Goals: Ability to identify and develop effective coping behaviors will improve and Compliance with prescribed medications will improve  Medication Management: RN will administer medications as ordered by provider, will assess and evaluate patient's response and provide education to patient for prescribed medication. RN will report any adverse and/or side effects to prescribing provider.  Therapeutic Interventions: 1 on 1 counseling sessions, Psychoeducation, Medication administration, Evaluate responses to treatment, Monitor vital signs and CBGs as ordered, Perform/monitor CIWA, COWS, AIMS and Fall Risk screenings as ordered, Perform wound care treatments as ordered.  Evaluation of Outcomes: Progressing   LCSW Treatment Plan for Primary Diagnosis: Schizoaffective disorder, bipolar type (HCC) Long Term Goal(s): Safe transition to appropriate next level of care at discharge, Engage patient in therapeutic group addressing interpersonal concerns.  Short Term Goals: Engage patient in aftercare planning with referrals and resources  Therapeutic Interventions: Assess for all discharge needs, 1 to 1 time with Social worker, Explore available resources and support systems, Assess for adequacy in community support network, Educate family and significant other(s) on suicide prevention,  Complete Psychosocial Assessment, Interpersonal group therapy.  Evaluation of Outcomes: Progressing  Return home, follow up outpt 3/7:  Based on lack of progress on 3/5, Dr asked for referral to [facility].  They asked for more information related to his seizures, and some of that is still pending, so application is not complete.  In meantime, Jesus Bryan has shown some progress, which makes [place] more hopeful to an eventual return to the community.  Progress in Treatment: Attending groups: Yes Participating in groups: Minimally Taking medication as prescribed: Yes Toleration medication: Yes, no side effects  reported at this time Family/Significant other contact made: No  Left message for SO at number listed in chart Patient understands diagnosis: No  Limited insight Discussing patient identified problems/goals with staff: Yes Medical problems stabilized or resolved: Yes Denies suicidal/homicidal ideation: Yes Issues/concerns per patient self-inventory: None Other: N/A  New problem(s) identified: Pt refusing to sign release for [facility] records   New Short Term/Long Term Goal(s): None identified at this time.   Discharge Plan or Barriers:   Reason for Continuation of Hospitalization: Disorganization Paranoia Medication stabilization   Estimated Length of Stay: 3-5 days  Attendees: Patient: 06/08/2016  12:36 PM  Physician: Jomarie Longs, MD 06/08/2016  12:36 PM  Nursing: Antonieta Iba, RN 06/08/2016  12:36 PM  RN Care Manager: Onnie Boer, RN 06/08/2016  12:36 PM  Social Worker: Richelle Ito 06/08/2016  12:36 PM  Recreational Therapist: Royston Cowper  06/08/2016  12:36 PM  Other: Tomasita Morrow 06/08/2016  12:36 PM  Other:  06/08/2016  12:36 PM    Scribe for Treatment Team:  Daryel Gerald LCSW 06/08/2016 12:36 PM

## 2016-06-08 NOTE — Progress Notes (Signed)
1: 1 Note    Pt at this time is in bed resting with eyes closed. Pt denied depression, anxiety, pain, SI, HI or AVH. Pt attended wrap-up group. Support, encouragement, and safe environment provided. 1:1 staff is present with Pt at this time. 1:1 monitoring continues for Pt's safety. 15-minute safety checks also continues.

## 2016-06-08 NOTE — Progress Notes (Signed)
[facility] Post 1:1 Observation Documentation  For the first (8) hours following discontinuation of 1:1 precautions, a progress note entry by nursing staff should be documented at least every 2 hours, reflecting the patient's behavior, condition, mood, and conversation.  Use the progress notes for additional entries.  Time 1:1 discontinued:  1400  Patient's Behavior:  Pt is calm  Patient's Condition:  Pt is in the group meeting and participating.  Patient's Conversation:  Pt is pleased to be off 1:1  Bethann PunchesJane O Adlynn Lowenstein 06/08/2016, 2:01 PM

## 2016-06-08 NOTE — Progress Notes (Signed)
1:1 Note Pt is at the nurses station talking to staff. Pt stated he is feeling better and ready to go home. Pt stated he will try and call his family and see if he can be discharged soon. No seizure activities observed or reported, remains on 1:1 obs for safety, will continue to monitor.

## 2016-06-08 NOTE — Progress Notes (Signed)
1:1 Note: Pt at this time is in bed resting with eyes closed. Pt does not look to be in any distress at this time. 1:1 staff is present in room with Pt at this time. 1:1 monitoring continues for Pt's safety. 15-minute safety checks also continues at this time. 

## 2016-06-08 NOTE — Progress Notes (Signed)
Recreation Therapy Notes  Date: 06/08/16 Time: 1000 Location: 500 Hall Dayroom  Group Topic: Wellness  Goal Area(s) Addresses:  Patient will define components of whole wellness. Patient will verbalize benefit of whole wellness.  Intervention:  20 plastic cups, 2 small balls  Activity: Bowling.  Patients were divided into two teams.  LRT set up the cups for each team like bowling pins.  Each player was given to chances per turn to knock over as many cups as possible.  Each cup knocked down counted as one point.  The first team to reach 80 points won the game.  Education: Wellness, Building control surveyorDischarge Planning.   Education Outcome: Acknowledges education/In group clarification offered/Needs additional education.   Clinical Observations/Feedback: Pt did not attend group.   Caroll RancherMarjette Harrington Jobe, LRT/CTRS         Caroll RancherLindsay, Ruhan Borak A 06/08/2016 12:26 PM
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
## 2016-06-08 NOTE — Progress Notes (Signed)
[facility] Group Notes:  (Nursing/MHT/Case Management/Adjunct)  Date:  06/08/2016  Time:  9:09 PM  Type of Therapy:  Psychoeducational Skills  Participation Level:  Minimal  Participation Quality:  Attentive  Affect:  Appropriate  Cognitive:  Lacking  Insight:  Limited  Engagement in Group:  Developing/Improving  Modes of Intervention:  Education  Summary of Progress/Problems: The patient had little to share except to say that he was "thankful for the day". He had nothing else to share with the group. As for the theme of the day, his personal development will include trying to "stay focused".   Hazle CocaGOODMAN, Aundra Espin S 06/08/2016, 9:09 PM

## 2016-06-08 NOTE — Progress Notes (Signed)
[facility] Post 1:1 Observation Documentation  For the first (8) hours following discontinuation of 1:1 precautions, a progress note entry by nursing staff should be documented at least every 2 hours, reflecting the patient's behavior, condition, mood, and conversation.  Use the progress notes for additional entries.  Time 1:1 discontinued:  1400  Patient's Behavior:  calm  Patient's Condition:  Safe, no seizures reported or witnessed  Patient's Conversation:  Happy to have called the family.  Bethann PunchesJane O Seann Genther 06/08/2016, 7:16 PM

## 2016-06-09 DIAGNOSIS — G4700 Insomnia, unspecified: Secondary | ICD-10-CM

## 2016-06-09 DIAGNOSIS — R569 Unspecified convulsions: Secondary | ICD-10-CM

## 2016-06-09 DIAGNOSIS — F250 Schizoaffective disorder, bipolar type: Principal | ICD-10-CM

## 2016-06-09 DIAGNOSIS — F419 Anxiety disorder, unspecified: Secondary | ICD-10-CM

## 2016-06-09 DIAGNOSIS — G9340 Encephalopathy, unspecified: Secondary | ICD-10-CM

## 2016-06-09 NOTE — [facility] Group Notes (Signed)
[facility]  06/09/2016 , 1:18 PM    Type of Therapy:  [employer] Presentation  Participation Level:  Active  Participation Quality:  Attentive  Affect:  Blunted  Cognitive:  Oriented  Insight:  Limited  Engagement in Therapy:  Engaged  Modes of Intervention:  Discussion, Education and Socialization  Summary of Progress/Problems:  Onalee HuaDavid from [employer] came to present his recovery story and play the guitar.  Stayed the entire time, engaged throughout.  Daryel Geraldorth, Mung Rinker B 06/09/2016 , 1:18 PM

## 2016-06-09 NOTE — Progress Notes (Signed)
Patient ID: Jesus Bryan, male   DOB: 1991-02-10, 26 y.o.   MRN: [id number] D: Client visible on the unit, seen in dayroom watching TV and interacting peers. Client reports he has been attending group and plans to leave tomorrow. Client reports upon discharge "going to my son grandma house" "I'll be seeing the doctor I was seeing, going to the doctor off [address] [facility]. A: Writer provided emotional support encouraged client to follow up with discharge plans. Medications reviewed, administered as ordered. Staff will monitor q5815min for safety. R: Client is safe on the unit, attended group.

## 2016-06-09 NOTE — Progress Notes (Signed)
D: Pt at the time of assessment was alert and oriented x4. Pt at the time denied depression, anxiety, pain, SI, HI or AVH. Pt however was flat isolative and withdrawn to room. Pt also look to be nervous about getting another seizure because he keep asking to get keppra. Pt is also preoccupied about going home; "now that I am off 1:1 will I be able to go home tomorrow?" Pt continues to minimize. Pt remained calm and cooperative.  A: Medications offered as prescribed. All patient's questions and concerns addressed. Support, encouragement, and safe environment provided. 15-minute safety checks continue.   R: Pt was med compliant.  Pt attended wrap-up group. Safety checks continue
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
## 2016-06-09 NOTE — [facility] Suicide Risk Assessment (Signed)
[facility] INPATIENT:  Family/Significant Other Suicide Prevention Education  Suicide Prevention Education:  Education Completed; Jesus Bryan, mother in law of patient, [phone] has been identified by the patient as the family member/significant other with whom the patient will be residing, and identified as the person(s) who will aid the patient in the event of a mental health crisis (suicidal ideations/suicide attempt).  With written consent from the patient, the family member/significant other has been provided the following suicide prevention education, prior to the and/or following the discharge of the patient.  The suicide prevention education provided includes the following:  Suicide risk factors  Suicide prevention and interventions  National Suicide Hotline telephone number  [facility] assessment telephone number  [facility] 911  [facility] and/or [facility]idential [facility] telephone number  Request made of family/significant other to:  Remove weapons (e.g., guns, rifles, knives), all items previously/currently identified as safety concern.    Remove drugs/medications (over-the-counter, prescriptions, illicit drugs), all items previously/currently identified as a safety concern.  The family member/significant other verbalizes understanding of the suicide prevention education information provided.  The family member/significant other agrees to remove the items of safety concern listed above. Called SaxtonMichelle today with Jesus Bryan.  He asked her to come pick him up.  I told her he is not being released, partially because he has no place to stay.  She offered for him to stay there.  "He is my son in law.  Of course he is welcome here."  Stated she is willing to pick him up whenever we are ready to release him.  I went over treatment team recommendations and crises plan with her.  Jesus Bryan 06/09/2016, 2:50 PM

## 2016-06-09 NOTE — Progress Notes (Signed)
Adult Psychoeducational Group Note  Date:  06/09/2016 Time:  2015  Group Topic/Focus:  Wrap-Up Group:   The focus of this group is to help patients review their daily goal of treatment and discuss progress on daily workbooks.  Participation Level:  Active  Participation Quality:  Appropriate  Affect:  Appropriate  Cognitive:  Alert and Appropriate  Insight: Appropriate  Engagement in Group:  Engaged  Modes of Intervention:  Discussion  Additional Comments:  Patient states he had a blessed day and that he was thankful to see another day.   Curly RimBarbara B Tina Temme 06/09/2016, 9:00 PM

## 2016-06-09 NOTE — Progress Notes (Signed)
Recreation Therapy Notes  Date: 06/09/16 Time: 0945 Location: 500 [address] Dayroom   Group Topic: Communication, Team Building, Problem Solving  Goal Area(s) Addresses:  Patient will effectively work with peer towards shared goal.  Patient will identify skills used to make activity successful.  Patient will identify how skills used during activity can be used to reach post d/c goals.   Intervention: STEM Activity  Activity: Stage managerLanding Pad. In teams patients were given 12 plastic drinking straws and Bryan length of masking tape. Using the materials provided patients were asked to build Bryan landing pad to catch Bryan golf ball dropped from approximately 6 feet in the air.   Education: Pharmacist, communityocial Skills, Discharge Planning   Education Outcome: Acknowledges education/In group clarification offered/Needs additional education.   Clinical Observations/Feedback: Pt did not attend group.   Jesus RancherMarjette Tyron Bryan, LRT/CTRS      Jesus RancherLindsay, Jesus Bryan 06/09/2016 11:51 AM

## 2016-06-09 NOTE — Progress Notes (Signed)
DAR NOTE: Pt present with flat affect and depressed mood in the unit. Pt has been isolating himself and not interacting much. Pt denies physical pain, took all his meds as scheduled. As per self inventory, pt had a good night sleep, good appetite, normal energy, and good concentration. Pt rate depression at 0, hopeless ness at 0, and anxiety at 0. Pt's safety ensured with 15 minute and environmental checks. Pt currently denies SI/HI and A/V hallucinations. Pt verbally agrees to seek staff if SI/HI or A/VH occurs and to consult with staff before acting on these thoughts. Will continue POC.

## 2016-06-09 NOTE — Progress Notes (Signed)
[facility] Post 1:1 Observation Documentation  For the first (8) hours following discontinuation of 1:1 precautions, a progress note entry by nursing staff should be documented at least every 2 hours, reflecting the patient's behavior, condition, mood, and conversation.  Use the progress notes for additional entries.  Time 1:1 discontinued:  1400  Patient's Behavior:  Preoccupied about going home.  Patient's Condition:  No seizure reported or observed; Pt is flat  Patient's Conversation:  states, "now that I am off 1:1 when do you think I can go home"    Jesus Bryan 06/09/2016, 12:01 AM

## 2016-06-09 NOTE — Progress Notes (Signed)
[facility] Post 1:1 Observation Documentation  For the first (8) hours following discontinuation of 1:1 precautions, a progress note entry by nursing staff should be documented at least every 2 hours, reflecting the patient's behavior, condition, mood, and conversation.  Use the progress notes for additional entries.  Time 1:1 discontinued:  1400  Patient's Behavior:  Nervous about getting another seizure: continually ask about getting Keppra.  Patient's Condition:  No seizure reported or observed; Pt is flat.  Patient's Conversation:  Preoccupied about going home.  Rosaire Cueto T Tyrik Stetzer 06/09/2016, 12:06 AM

## 2016-06-10 MED ORDER — OLANZAPINE 5 MG PO TABS
25.0000 mg | ORAL_TABLET | Freq: Every day | ORAL | Status: DC
  Administered 2016-06-10 – 2016-06-13 (×4): 25 mg via ORAL
  Filled 2016-06-10: qty 2
  Filled 2016-06-10 (×5): qty 1

## 2016-06-10 NOTE — Progress Notes (Signed)
At 1230pm I was  called to patient's room because he was reportedly having a seizure;  MHT  Tiffany stated that she was doing safety check/bringing patient his lunch tray  when he began to have seizure-like activity.  He was sitting on bed at time.  She lowered him to the floor. Immediately I arrived to patient's room and found him on floor on left side with MHT at his side.  He was he shaking his arms and legs and was slobbering and spitting.   His respirations were unlabored and his skin was warm and dry. The shaking continued for approximately two to three minutes.  During this time, patient was not incontinent of urine and did not roll his eyes back in head or have other classic signs of seizures.  When seizure-like activity ceased patient was not post-ictal.  He sat on floor for a few minutes then allowed me to assist him back to bed.  He sat on bed for approximately one to two minutes and then got up and started walking in room.  Ativan 1 mg po given to patient by Jesusita Okaan, RN.  BP was 134/98, HR 89, RR 18.       Patient had received Keppra 1500mg  at 0753 this a.m.  During the morning he had requested additional doses of Keppra several times.  I educated patient that I had already given him his morning dose and that he did not have more Keppra ordered until 1700.  Patient kept insisting that  he receive extra Keppra and basically threatened to to seize if we didn't give it to him. He stood up and started shaking his head and arms and I  asked him if I could given him something for anxiety and he agreed.  I gave patient Haldol 5mg  at 1021 for anxiety and at 1100 he again told me again that he wanted more Keppra.   MHT Tiffany also told me at 1100 that patient told her that he had chest pain.  I asked him if he had any chest pain and he said "No, I didn't eat breakfast and I want something to eat." I gave patient a snack at that time.  Patient showed no signs/symptoms of physical distress.  His respirations  were even and unlabored; skin was warm and dry; He was awake, alert and MAEx4; his gait was balanced and steady. He had no nausea or vomiting.  He continued to deny chest pain but reported mild general discomfort for which Tylenol was given at 1104 which was effective.      Dr. Elna BreslowEappen and Harvie BridgeMae, NP notified of patient status and both assessed patient at bedside. Currently patient in no distress. MHT at bedside.

## 2016-06-10 NOTE — Progress Notes (Signed)
This note pertains to patient's continued refusal to take his medications (Depakote).  Discussed with patient the importance of medication, Keppra, patient was witnessed to have "seizure like" episode.  Patient did not have incontinence, post ictally, managed to get up and be alert immediately.  He was focused on Keppra that wa dispensed by [facility].  This NP, showed him his PTA from personal locker.  To ensure to patient that he was taking the same dose and same medication.    Patient remains delusional.  He kept stating that he owned [facility].  He states that he would not take any other meds.  He continues to refuse Depakote (mood stabiler).  This NP impressed upon him that he needed to take Keppra and we are following recommendations of a Neuro Specialty on adjusting doses.    D Smith neuro hospitalist PA-C called to consult and recommended to keep patient on 3000 mg of Keppra daily.  Patient's Depakote's indication was for mood stabilization.  Clearly, from spending time with patient, he is actively psychotic., however, historically, when his antipsychotics are increased in dose or another agent is introduced, patient;s seizure threshold is lowered and he has seizure event.    Patient is on 22 mg of Olanzapine.  Careful titration is required given the fact that patient's seizure is not controlled and is unstable.  To note, EEG revealed normal results.  Adaline Sill Smith PA-C aware summary report, on EPIC.
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
## 2016-06-10 NOTE — [facility] Group Notes (Signed)
[facility] LCSW Group Therapy  06/10/2016  1:05 PM  Type of Therapy:  Group therapy  Participation Level:  Active  Participation Quality:  Attentive  Affect:  Flat  Cognitive:  Oriented  Insight:  Limited  Engagement in Therapy:  Limited  Modes of Intervention:  Discussion, Socialization  Summary of Progress/Problems:  Chaplain was here to lead a group on themes of hope and courage.In and out multiple times.  Difficulty sitting still.  Did not share.  Eventually did not return. Jesus Bryan, Jesus Bryan B 06/10/2016 12:53 PM

## 2016-06-10 NOTE — Plan of Care (Signed)
Problem: Safety: Goal: Periods of time without injury will increase Outcome: Progressing Patient on 1:1 Observation: Safety maintained on unit

## 2016-06-10 NOTE — Progress Notes (Signed)
Recreation Therapy Notes  Date: 06/10/16 Time: 1000 Location: 500 Hall Dayroom  Group Topic: Self-Expression  Goal Area(s) Addresses:  Patient will identify positive benefits of expressing self. Patient will verbalize benefit of increased self-expression post d/c.  Intervention: Picture frame worksheets, magazines, scissors, construction paper, markers, glue sticks  Activity: Picture This.  Patients were to envision the place they would most want to travel to if they had the means to do so or something meaning full they want to accomplish.  They were to then take those ideas and try to find pictures from the magazines or draw what they visualized that place to be inside the picture frame.    Education:  Self-Expression, Building control surveyorDischarge Planning.   Education Outcome: Acknowledges education/In group clarification offered/Needs additional education  Clinical Observations/Feedback: Pt did not attend group.   Caroll RancherMarjette Deandra Goering, LRT/CTRS         Lillia AbedLindsay, Aminat Shelburne A 06/10/2016 1:11 PM

## 2016-06-11 NOTE — [facility] Group Notes (Signed)
[facility] Group Notes: (Clinical Social Work)   06/11/2016      Type of Therapy:  Group Therapy   Participation Level:  Did Not Attend despite MHT prompting   Ambrose MantleMareida Grossman-Orr, LCSW 06/11/2016, 12:10 PM

## 2016-06-11 NOTE — Progress Notes (Signed)
D: Patient flat and withdrawn this shift. Patient cautious in dealing with staff members and peers. A: Encourage staff/peer interaction, medication compliance, and group participation. Administer medications as ordered, maintain Q 15 minute safety checks. R: Pt compliant with medications and attended group session. Pt denies SI at this time and verbally contracts for safety. No signs/symptoms of distress noted.

## 2016-06-11 NOTE — [facility] Group Notes (Signed)
Adult Psychoeducational Group Note  Date:  06/11/2016 Time:  8:00 PM  Group Topic/Focus:  Wrap-Up Group:   The focus of this group is to help patients review their daily goal of treatment and discuss progress on daily workbooks.  Participation Level:  Minimal  Participation Quality:  Inattentive and Redirectable  Affect:  Flat  Cognitive:  Lacking  Insight: Limited  Engagement in Group:  Limited  Modes of Intervention:  Confrontation and Discussion  Additional Comments:  Patient reported working to "stay focused."  Patient confirmed feeling that he was able to accomplish his goal.    Talbert NanSLOAN, Shataya Winkles N 06/11/2016, 9:53 PM

## 2016-06-11 NOTE — Progress Notes (Signed)
1: 1 Note   Pt at this time is in bed restingwith eyes closed. Pt denied depression, anxiety, pain, SI, HI or AVH. Pt attended wrap-up group. Support, encouragement, and safe environment provided. Pt was med compliant. 1:1 staff is present with Pt at this time. 1:1 monitoring continues for Pt's safety. 15-minute safety checks also continues.

## 2016-06-11 NOTE — Progress Notes (Signed)
Patient has been maintained seizure free throughout the night.  1:1 discontinued at 0820 am.  Patient's environment is secure continue to monitor as planned.   Patient remains seizure free patient has been compliant with medications and has been attending groups.  Patient is still being monitored on high fall risk at this time for safety.  

## 2016-06-11 NOTE — Progress Notes (Signed)
Patient has been maintained seizure free throughout the night.  1:1 discontinued at 0820 am.  Patient's environment is secure continue to monitor as planned.
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
## 2016-06-11 NOTE — Progress Notes (Signed)
1:1 Note: Pt at this time is in bed resting with eyes closed. Pt does not look to be in any distress at this time. 1:1 staff is present in room with Pt at this time. 1:1 monitoring continues for Pt's safety. 15-minute safety checks also continues at this time. 

## 2016-06-11 NOTE — Progress Notes (Signed)
Patient has been maintained seizure free throughout the night.  1:1 discontinued at 0820 am.  Patient's environment is secure continue to monitor as planned.   Patient remains seizure free patient has been compliant with medications and has been attending groups.  Patient is still being monitored on high fall risk at this time for safety.

## 2016-06-12 LAB — LEVETIRACETAM LEVEL: LEVETIRACETAM: 18.7 ug/mL (ref 10.0–40.0)

## 2016-06-12 NOTE — Progress Notes (Signed)
[facility] Group Notes:  (Nursing/MHT/Case Management/Adjunct)  Date:  06/12/2016  Time:  9:02 PM   Type of Therapy:  Psychoeducational Skills  Participation Level:  Active  Participation Quality:  Attentive  Affect:  Appropriate  Cognitive:  Appropriate  Insight:  Appropriate  Engagement in Group:  Developing/Improving  Modes of Intervention:  Education  Summary of Progress/Problems: The patient described his day as having been "fine". The patient verbalized that he enjoyed the meals in the cafeteria and that the medication has helped him to feel better. The patient could not state who his support system will be following discharge (theme of the day).   Hazle CocaGOODMAN, Milca Sytsma S 06/12/2016, 9:02 PM

## 2016-06-12 NOTE — Progress Notes (Signed)
D: Pt presents with flat affect and depressed mood. Pt denies depression, anxiety and AVH. Pt denies suicidal thoughts. Pt have minimal interaction on the unit and appears withdrawn. Pt compliant with taking meds. No side effects to meds verbalized by pt.  A: Medications reviewed with pt. Medications administered as ordered per MD. Verbal support provided. Pt encouraged to attend groups. 15 minute checks performed for safety.  R: Pt compliant with tx.

## 2016-06-12 NOTE — [facility] Group Notes (Signed)
[facility] Group Notes: (Clinical Social Work)   06/12/2016      Type of Therapy:  Group Therapy   Participation Level:  Did Not Attend despite MHT prompting   Rooney Swails Grossman-Orr, LCSW 06/12/2016, 4:55 PM     

## 2016-06-12 NOTE — Progress Notes (Signed)
D: patient superficially bright and with fixed smile on assessment. Patient laughing nervously and appears to be anxious during the shift.  A: Encourage staff/peer interaction, medication compliance, and group participation. Administer medications as ordered, maintain Q 15 minute safety checks. R: Pt compliant with medications and attended group session. Pt denies SI at this time and verbally contracts for safety. No signs/symptoms of distress noted.
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
## 2016-06-13 NOTE — Progress Notes (Signed)
[facility] Group Notes:  (Nursing/MHT/Case Management/Adjunct)  Date:  06/13/2016  Time:  9:06 PM  Type of Therapy:  Psychoeducational Skills  Participation Level:  Active  Participation Quality:  Monopolizing and Redirectable  Affect:  Flat  Cognitive:  Disorganized and Lacking  Insight:  Lacking  Engagement in Group:  Monopolizing  Modes of Intervention:  Education  Summary of Progress/Problems: The patient expressed in group that he had a good day and that he enjoyed going to the gym. The patient then went off on a tangent in which he used the word "thankful" over and over again, ie. Thankful for the food , water, being alive, etc. In terms of the theme for the day, his wellness strategy will be to stay focused on himself and to look out for his brothers and sisters.   Hazle CocaGOODMAN, Dontasia Miranda S 06/13/2016, 9:06 PM

## 2016-06-13 NOTE — Progress Notes (Signed)
D: Pt brighter today on approach. Pt denies depression, anxiety and AVH. Pt fixated on discharging home this afternoon. Pt repeatedly stated "she said that if I was good over the weekend I could go home". Writer informed pt that he can discuss discharge plans with Dr. Elna BreslowEappen on 06/14/16. Pt compliant with taking meds. No side effects to meds verbalized by pt. A: Medications reviewed with pt. Medications administered as ordered per MD. Verbal support provided. Pt encouraged to attend groups. 15 minute checks performed for safety. R: Pt safety maintained.

## 2016-06-13 NOTE — Progress Notes (Signed)
Recreation Therapy Notes  Date: 06/13/16 Time: 1000 Location: 500 [address] Dayroom  Group Topic: Coping Skills  Goal Area(s) Addresses:  Patients will be able to identify positive coping skills. Patients will be able to identify the benefits of using coping skills post d/c.  Behavioral Response: Minimal  Intervention: Pencils, blank mindmap  Activity: Coping Skills Mindmap.  Patients were given a blank mindmap.  There were eight boxes to be filled in with various things that cause us to use coping skills.  Patients and LRT filled out these eight boxes together with things such as depression, anxiety, sleeplessness, etc.  Each of these eight boxes has three boxes extending from them.  The patients were to come up with three coping skills for each of the eight symptoms individually.  The group would then come back together and go over the coping skills.  Education: PharmacologistCoping Skills, Building control surveyorDischarge Planning.   Education Outcome: Acknowledges understanding/In group clarification offered/Needs additional education.   Clinical Observations/Feedback: Pt worked on filling out his mindmap.  Pt was quiet and didn't provide any answers in the open discussion.  Pt was attentive during group.   Caroll RancherMarjette Aviance Cooperwood, LRT/CTRS      Caroll RancherLindsay, Toluwanimi Radebaugh A 06/13/2016 11:29 AM
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
## 2016-06-13 NOTE — Progress Notes (Signed)
Patient ID: Jesus CofferWilly Bryan, male   DOB: 03/15/1991, 26 y.o.   MRN: [id number] D: Client visible on the unit, reports "doing okay" "I been going to groups" Client is pleasant, animated at times but has a sad affect noting that he wants to go home. Client continues to point to son's name tattooed on his arm "Zion" expressing the desire to be home with him. "do you think she will let me go" Client making reference to the physician and discharge plans. A: Writer provided emotional support, encouraged client to speak with physician tomorrow. Medications reviewed, administered as ordered. Staff will monitor q4915min for safety. R: Client is safe on the unit, attended group.

## 2016-06-13 NOTE — [facility] Group Notes (Signed)
[facility] LCSW Group Therapy  06/13/2016 , 2:40 PM   Type of Therapy:  Group Therapy  Participation Level:  Active  Participation Quality:  Attentive  Affect:  Appropriate  Cognitive:  Alert  Insight:  Improving  Engagement in Therapy:  Engaged  Modes of Intervention:  Discussion, Exploration and Socialization  Summary of Progress/Problems: Today's group focused on the term Diagnosis.  Participants were asked to define the term, and then pronounce whether it is a negative, positive or neutral term.  Stayed the entire time-minimal interaction and basically checked out as he is clearly disappointed he is not going today.  Insists that he was to be on the list for d/c.  Not acting out, just focused on one thing, and one thing alone.  Daryel Geraldorth, Lovina Zuver B 06/13/2016 , 2:40 PM

## 2016-06-13 NOTE — Tx Team (Signed)
Interdisciplinary Treatment and Diagnostic Plan Update  06/13/2016 Time of Session: 8:43 AM  Jesus Bryan MRN: [id number]  Principal Diagnosis: Schizoaffective disorder [facility])  Secondary Diagnoses: Principal Problem:   Schizoaffective disorder ([place]) Active Problems:   Delusions ([place])   Seizures ([place])   Current Medications:  Current Facility-Administered Medications  Medication Dose Route Frequency Provider Last Rate Last Dose  . acetaminophen (TYLENOL) tablet 650 mg  650 mg Oral Q6H PRN Derrill Center, NP   650 mg at 06/10/16 1104  . alum & mag hydroxide-simeth (MAALOX/MYLANTA) 200-200-20 MG/5ML suspension 30 mL  30 mL Oral Q4H PRN Derrill Center, NP      . diphenhydrAMINE (BENADRYL) capsule 25 mg  25 mg Oral Q8H PRN Ursula Alert, MD   25 mg at 06/10/16 1021   Or  . diphenhydrAMINE (BENADRYL) injection 25 mg  25 mg Intramuscular Q6H PRN Saramma Eappen, MD      . feeding supplement (ENSURE ENLIVE) (ENSURE ENLIVE) liquid 237 mL  237 mL Oral BID BM Saramma Eappen, MD   237 mL at 06/12/16 1437  . haloperidol (HALDOL) tablet 5 mg  5 mg Oral Q8H PRN Ursula Alert, MD   5 mg at 06/10/16 1021   Or  . haloperidol lactate (HALDOL) injection 5 mg  5 mg Intramuscular Q8H PRN Ursula Alert, MD      . levETIRAcetam (KEPPRA) tablet 1,500 mg  1,500 mg Oral BID Kerrie Buffalo, NP   1,500 mg at 06/13/16 2725  . LORazepam (ATIVAN) tablet 1 mg  1 mg Oral Q6H PRN Ursula Alert, MD   1 mg at 06/12/16 2043   Or  . LORazepam (ATIVAN) injection 1 mg  1 mg Intramuscular Q6H PRN Ursula Alert, MD   1 mg at 06/02/16 0751  . magnesium hydroxide (MILK OF MAGNESIA) suspension 30 mL  30 mL Oral Daily PRN Derrill Center, NP      . OLANZapine (ZYPREXA) tablet 25 mg  25 mg Oral QHS Ursula Alert, MD   25 mg at 06/12/16 2042  . traZODone (DESYREL) tablet 100 mg  100 mg Oral QHS Rozetta Nunnery, NP   100 mg at 06/12/16 2042    PTA Medications: Prescriptions Prior to Admission  Medication Sig Dispense Refill Last  Dose  . divalproex (DEPAKOTE) 500 MG DR tablet Take 750 mg by mouth at bedtime.     . ferrous sulfate 325 (65 FE) MG tablet Take 325 mg by mouth daily with breakfast.     . levETIRAcetam (KEPPRA) 500 MG tablet Take 500 mg by mouth 3 (three) times daily.     Marland Kitchen LORazepam (ATIVAN) 1 MG tablet Take 1 mg by mouth 2 (two) times daily.     Marland Kitchen OLANZapine (ZYPREXA) 20 MG tablet Take 20 mg by mouth at bedtime.       Treatment Modalities: Medication Management, Group therapy, Case management,  1 to 1 session with clinician, Psychoeducation, Recreational therapy.   Physician Treatment Plan for Primary Diagnosis: Schizoaffective disorder ([place]) Long Term Goal(s): Improvement in symptoms so as ready for discharge  Short Term Goals: Ability to verbalize feelings will improve Ability to disclose and discuss suicidal ideas Ability to demonstrate self-control will improve Compliance with prescribed medications will improve Ability to identify changes in lifestyle to reduce recurrence of condition will improve Ability to maintain clinical measurements within normal limits will improve Compliance with prescribed medications will improve Ability to identify triggers associated with substance abuse/mental health issues will improve  Medication Management: Evaluate patient's  response, side effects, and tolerance of medication regimen.  Therapeutic Interventions: 1 to 1 Bryan, Unit Group Bryan and Medication administration.  Evaluation of Outcomes: Adequate for Discharge  Physician Treatment Plan for Secondary Diagnosis: Principal Problem:   Schizoaffective disorder ([place]) Active Problems:   Delusions ([place])   Seizures ([place])   Long Term Goal(s): Improvement in symptoms so as ready for discharge  Short Term Goals: Ability to verbalize feelings will improve Ability to disclose and discuss suicidal ideas Ability to demonstrate self-control will improve Compliance with prescribed medications will  improve Ability to identify changes in lifestyle to reduce recurrence of condition will improve Ability to maintain clinical measurements within normal limits will improve Compliance with prescribed medications will improve Ability to identify triggers associated with substance abuse/mental health issues will improve  Medication Management: Evaluate patient's response, side effects, and tolerance of medication regimen.  Therapeutic Interventions: 1 to 1 Bryan, Unit Group Bryan and Medication administration.  Evaluation of Outcomes: Adequate for Discharge   3/2:  :Patient seen as grandiose , delusional, making irrelevant statements , has thought blocking , is a limited historian , attempted to obtain collateral information - unable to reach family.  For schizoaffective do : Increase Zyprexa to 20 mg po qhs.  For insomnia: Trazodone 50 mg po qhs prn.  For anxiety/agitation: PRN medications as per unit protocol.  For seizure do:  Increase Keppra to 1000 mg po bid as per neurology consult per EDP.                           Depakote DR 250 mg po tid . Pt refusing depakote.  Will discontinue 1:1 precaution for safety reasons.Will reassess.  CT scan brain - reviewed - no acute abnormalities.  3/7: Will increase Zyprexa to 22.5 mg po qhs.. Dose increased cautiously due to his hx of seizures , triggered also by antipsychotic dose increase as per [facility] records.  For insomnia: reviewed as below Will continue Trazodone 100 mg po qhs .  For anxiety/agitation: Will continue PRN medications as per unit protocol.  For seizure do: EEG completed - pending result. Will continue Keppra  1500 mg po bid as per neurology reconsult.Ordered Keppra level-pending. Please also see notes in EHR per NP. Discontinue 1:1 precaution.   RN Treatment Plan for Primary Diagnosis: Schizoaffective disorder ([place]) Long Term Goal(s): Knowledge of disease and therapeutic regimen to maintain health will  improve  Short Term Goals: Ability to identify and develop effective coping behaviors will improve and Compliance with prescribed medications will improve  Medication Management: RN will administer medications as ordered by provider, will assess and evaluate patient's response and provide education to patient for prescribed medication. RN will report any adverse and/or side effects to prescribing provider.  Therapeutic Interventions: 1 on 1 counseling Bryan, Psychoeducation, Medication administration, Evaluate responses to treatment, Monitor vital signs and CBGs as ordered, Perform/monitor CIWA, COWS, AIMS and Fall Risk screenings as ordered, Perform wound care treatments as ordered.  Evaluation of Outcomes: Adequate for Discharge   LCSW Treatment Plan for Primary Diagnosis: Schizoaffective disorder [facility]) Long Term Goal(s): Safe transition to appropriate next level of care at discharge, Engage patient in therapeutic group addressing interpersonal concerns.  Short Term Goals: Engage patient in aftercare planning with referrals and resources  Therapeutic Interventions: Assess for all discharge needs, 1 to 1 time with Social worker, Explore available resources and support systems, Assess for adequacy in community support network, Educate family and significant other(s)  on suicide prevention, Complete Psychosocial Assessment, Interpersonal group therapy.  Evaluation of Outcomes: Met  Return home, follow up outpt 3/7:  Based on lack of progress on 3/5, Dr asked for referral to [facility].  They asked for more information related to his seizures, and some of that is still pending, so application is not complete.  In meantime, Jesus Bryan has shown some progress, which makes [place] more hopeful to an eventual return to the community.  Progress in Treatment: Attending groups: Yes Participating in groups: Minimally Taking medication as prescribed: Yes Toleration medication: Yes, no side effects reported at this  time Family/Significant other contact made: Yes  Mother in Set designer Patient understands diagnosis: No  Limited insight Discussing patient identified problems/goals with staff: Yes Medical problems stabilized or resolved: Yes Denies suicidal/homicidal ideation: Yes Issues/concerns per patient self-inventory: None Other: N/A  New problem(s) identified:    New Short Term/Long Term Goal(s): None identified at this time.   Discharge Plan or Barriers: Go stay with mother in law, follow up [facility] 3/14:  Mother in law said no.  Pt to be transported to shelter by Jesus Bryan with Jesus Bryan [facility]; follow up [place] if in [place], [place] if in [place]  Reason for Continuation of Hospitalization:     Estimated Length of Stay: D/C today  Attendees: Patient: 06/13/2016  8:43 AM  Physician: Ursula Alert, MD 06/13/2016  8:43 AM  Nursing: Hoy Register, RN 06/13/2016  8:43 AM  RN Care Manager: Lars Pinks, RN 06/13/2016  8:43 AM  Social Worker: Ripley Fraise 06/13/2016  8:43 AM  Recreational Therapist: Laretta Bolster  06/13/2016  8:43 AM  Other: Norberto Sorenson 06/13/2016  8:43 AM  Other:  06/13/2016  8:43 AM    Scribe for Treatment Team:  Roque Lias LCSW 06/13/2016 8:43 AM

## 2016-06-14 DIAGNOSIS — F250 Schizoaffective disorder, bipolar type: Secondary | ICD-10-CM

## 2016-06-14 MED ORDER — LORAZEPAM 1 MG PO TABS: 2.0000 mg | ORAL_TABLET | Freq: Once | ORAL | Status: DC

## 2016-06-14 MED ORDER — OLANZAPINE 5 MG PO TABS: 5.0000 mg | ORAL_TABLET | Freq: Every day | ORAL | 0 refills | Status: DC

## 2016-06-14 MED ORDER — OLANZAPINE 5 MG PO TABS
5.0000 mg | ORAL_TABLET | Freq: Every day | ORAL | Status: DC
  Administered 2016-06-14: 5 mg via ORAL
  Filled 2016-06-14 (×3): qty 1

## 2016-06-14 MED ORDER — LORAZEPAM 2 MG/ML IJ SOLN: 2.0000 mg | Freq: Once | INTRAMUSCULAR | Status: DC

## 2016-06-14 MED ORDER — LEVETIRACETAM 750 MG PO TABS: 1500.0000 mg | ORAL_TABLET | Freq: Two times a day (BID) | ORAL | 0 refills | Status: DC

## 2016-06-14 MED ORDER — TRAZODONE HCL 100 MG PO TABS: 100.0000 mg | ORAL_TABLET | Freq: Every day | ORAL | 0 refills | Status: DC

## 2016-06-14 MED ORDER — OLANZAPINE 20 MG PO TABS: 20.0000 mg | ORAL_TABLET | Freq: Every day | ORAL | 0 refills | Status: DC

## 2016-06-14 MED ORDER — OLANZAPINE 10 MG PO TABS
20.0000 mg | ORAL_TABLET | Freq: Every day | ORAL | Status: DC
  Administered 2016-06-14: 20 mg via ORAL
  Filled 2016-06-14 (×3): qty 2

## 2016-06-14 NOTE — Plan of Care (Signed)
Problem: Coping: Goal: Ability to verbalize feelings will improve Outcome: Progressing Ability to verbalize feeling will improve AEB client interacting with peers and staff appropriately. "I want to go home, want to see my son" Client points to son tattooed on his arm "Zion"

## 2016-06-14 NOTE — Progress Notes (Signed)
D: Pt presented at med window smiling and pleasant. Cooperative with meds. Denied SI/HI/AVH.  A: Safety checks maintained.  R: Verbalized no concerns. Continues to follow with treatment plan.

## 2016-06-14 NOTE — Progress Notes (Signed)
Recreation Therapy Notes  Date: 06/14/16 Time: 1000 Location: 500 Hall Dayroom  Group Topic: Communication  Goal Area(s) Addresses:  Patient will effectively communicate with peers in group.  Patient will verbalize benefit of healthy communication. Patient will verbalize positive effect of healthy communication on post d/c goals.  Patient will identify communication techniques that made activity effective for group.   Behavioral Response: Minimal  Intervention:  Chairs, stopwatch, 3 small beach balls   Activity: Group Juggle.  Patients were placed in a circle.  Patients were given one small beach ball to toss back and forth between each other.  After some time, LRT would add another ball to the mix.  Patients were to now keep two balls moving within the circle.  After more time had passed, a third ball was added to the mix.  The balls could be bounced off the floor but could not come to a stop.  While the patients are tossing the balls within the circle, LRT is timing how long it takes for the balls to come to a complete stop.  If the balls come to a stop, LRT resets the the.  Education: Communication, Discharge Planning  Education Outcome: Acknowledges understanding/In group clarification offered/Needs additional education.   Clinical Observations/Feedback: Pt sat quietly by the window.  Pt gave minimal participation with prompting.  Pt left with doctor but returned.   Caroll RancherMarjette Sora Vrooman, LRT/CTRS        Caroll RancherLindsay, Travanti Mcmanus A 06/14/2016 11:31 AM

## 2016-06-14 NOTE — Progress Notes (Signed)
[facility] Group Notes:  (Nursing/MHT/Case Management/Adjunct)  Date:  06/14/2016  Time:  9:17 PM  Type of Therapy:  Psychoeducational Skills  Participation Level:  Minimal  Participation Quality:  Attentive  Affect:  Flat  Cognitive:  Lacking  Insight:  Lacking  Engagement in Group:  Limited  Modes of Intervention:  Education  Summary of Progress/Problems: Patient states that he is grateful for the help that he has received along with the food that he consumed today. His goal for tomorrow is to pray before he goes to bed.   Hazle CocaGOODMAN, Arena Lindahl S 06/14/2016, 9:17 PM
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
## 2016-06-14 NOTE — [facility] Group Notes (Signed)
[facility] LCSW Group Therapy  06/14/2016 1:15 pm  Type of Therapy: Process Group Therapy  Participation Level:  Active  Participation Quality:  Appropriate  Affect:  Flat  Cognitive:  Oriented  Insight:  Improving  Engagement in Group:  Limited  Engagement in Therapy:  Limited  Modes of Intervention:  Activity, Clarification, Education, Problem-solving and Support  Summary of Progress/Problems: Today's group addressed the issue of overcoming obstacles.  Patients were asked to identify their biggest obstacle post d/c that stands in the way of their on-going success, and then problem solve as to how to manage this.  Invited.  Chose to not attend  Jesus Bryan, Jesus Bryan 06/14/2016   12:27 PM

## 2016-06-14 NOTE — B[facility] Suicide Risk Assessment (Addendum)
[facility] Discharge Suicide Risk Assessment   Principal Problem: Schizoaffective disorder [facility](HCC) Discharge Diagnoses:  Patient Active Problem List   Diagnosis Date Noted  . Schizoaffective disorder, bipolar type (HCC) [F25.0]   . Acute encephalopathy [G93.40]   . Seizures (HCC) [R56.9] 05/30/2016  . Delusions (HCC) [F22]   . Schizoaffective disorder (HCC) [F25.9] 05/27/2016    Total Time spent with patient: 30 minutes  Musculoskeletal: Strength & Muscle Tone: within normal limits Gait & Station: normal Patient leans: N/A  Psychiatric Specialty Exam: Review of Systems  Psychiatric/Behavioral: Negative for depression, hallucinations and suicidal ideas. The patient is not nervous/anxious.   All other systems reviewed and are negative.   Blood pressure 109/72, pulse 95, temperature 97.2 F (36.2 C), temperature source Oral, resp. rate 18, height 5\' 6"  (1.676 m), weight 53.5 kg (118 lb), SpO2 98 %.Body mass index is 19.05 kg/m.  General Appearance: Casual  Eye Contact::  Fair  Speech:  Clear and Coherent409  Volume:  Normal  Mood:  Euthymic  Affect:  Appropriate  Thought Process:  Goal Directed and Descriptions of Associations: Intact  Orientation:  Full (Time, Place, and Person)  Thought Content:  Logical  Suicidal Thoughts:  No  Homicidal Thoughts:  No  Memory:  Immediate;   Fair Recent;   Fair Remote;   Fair  Judgement:  Fair  Insight:  Fair  Psychomotor Activity:  Normal  Concentration:  Fair  Recall:  FiservFair  Fund of Knowledge:Fair  Language: Fair  Akathisia:  No  Handed:  Right  AIMS (if indicated):   denies tremors, stiffness, involuntary movements  Assets:  Communication Skills Desire for Improvement  Sleep:  Number of Hours: 6.5  Cognition: WNL  ADL's:  Intact   Mental Status Per Nursing Assessment::   On Admission:     Demographic Factors:  Male  Loss Factors: NA  Historical Factors: Impulsivity  Risk Reduction Factors:   Living with another person,  especially a relative, Positive social support and Positive therapeutic relationship  Continued Clinical Symptoms:  Previous Psychiatric Diagnoses and Treatments  Cognitive Features That Contribute To Risk:  None    Suicide Risk:  Minimal: No identifiable suicidal ideation.  Patients presenting with no risk factors but with morbid ruminations; may be classified as minimal risk based on the severity of the depressive symptoms  Follow-up Information    [facility].   Specialty:  Behavioral Health Why:  Go to the walk-in clinic M-F between 8 and 11AM within the next 5 days for your hospital follow up appointment if [place] in [place].  Porfirio OarReggie King at [facility] [phone] can assist you with an appointment and transportation. Contact information: [address] [place] [address] [phone]        [facility] Follow up on 06/17/2016.   Why:  Friday at 8:30 with Nyra JabsFrancis Gill for your hospital follow up appointment Contact information: [address]  [place]  [phone]          Plan Of Care/Follow-up recommendations:  Activity:  no restrictions Diet:  regular Tests:  follow up on your prolactin level Other:  none  Kharter Sestak, MD 06/15/2016, 10:57 AM
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
## 2016-06-14 NOTE — Progress Notes (Signed)
Patient ID: Jesus CofferWilly Brindisi, male   DOB: December 16, 1990, 26 y.o.   MRN: [id number] D: Client visible on the unit tonight appears sad and subdued, sits in dayroom looking one way, reports "I'm okay" "they say I have to stay" Client responds when ask about housing "I need your help" A: Writer provided emotional support encouraged client to be patient as staff wants to help him with resource so he will have safe housing. "okay" Medications reviewed, administered as ordered. Staff will monitor q4115min for safety. R: Client is safe on the unit, attended group.

## 2016-06-15 ENCOUNTER — Encounter (HOSPITAL_COMMUNITY): Payer: Self-pay | Admitting: *Deleted

## 2016-06-15 NOTE — Progress Notes (Signed)
  [facility] :  Will you be returning to the same living situation after discharge:  Yes,  home At discharge, do you have transportation home?: Yes,  Reggie King with [facility] Do you have the ability to pay for your medications: Yes,  MCD  Release of information consent forms completed and in the chart;  Patient's signature needed at discharge.  Patient to Follow up at: Follow-up Information    [facility] Follow up.   Specialty:  Behavioral Health Why:  Go to the walk-in clinic M-F between 8 and 11AM within the next 5 days for your hospital follow up appointment [place].  Porfirio OarReggie King at [facility] [phone] can assist you with an appointment and transportation. Contact information: [address] [place] [address] [phone]        [facility] Follow up on 06/17/2016.   Why:  Friday at 8:30 with Nyra JabsFrancis Gill for your hospital follow up appointment Contact information: [address]  [place]  [phone]          Next level of care provider has access to [place] Link: No   Safety Planning and Suicide Prevention discussed: Yes,  yes  Have you used any form of tobacco in the last 30 days? (Cigarettes, Smokeless Tobacco, Cigars, and/or Pipes): Yes  Has patient been referred to the Quitline?: Patient refused referral  Patient has been referred for addiction treatment: N/A  Ida RogueRodney B Ebbie Cherry 06/15/2016, 11:11 AM

## 2016-06-15 NOTE — Plan of Care (Signed)
Problem: [facility] Participation in Recreation Therapeutic Interventions Goal: STG-Patient will attend/participate in Rec Therapy Group Ses STG-The Patient will attend and participate in Recreation Therapy Group Sessions  Outcome: Completed/Met Date Met: 06/15/16 Pt attended and participated in communication, coping skills, self-esteem, wellness and anger management recreation therapy sessions.  Victorino Sparrow, LRT/CTRS

## 2016-06-15 NOTE — Progress Notes (Signed)
Nursing Discharge Note 06/15/2016 [id number]  Data Reports sleeping good.  Did not complete self-inventory sheet.   Affect blunted mood euthymic per patient.  Denies HI, SI, AVH.  Attending groups.  Received discharge orders.  Action Spoke with patient 1:1, nurse offered support to patient throughout shift.  Reviewed medications, discharge instructions, and follow up appointments with patient. Medication scripts reviewed and given to patient- reinforced importance of med compliance to prevent seizures and to maintain stable mood.  Paperwork, AVS, SRA, and transition record handed to patient.   Escorted off of unit at 1245. Belongings returned per belongings form.  Discharged with social worker Reggie.    Response Verbalized understanding of discharge teaching, repeated back to RN correct instructions for taking medicines.  Agrees to contact someone or 911 with thoughts/intent to harm self or others.    To follow up per AVS.

## 2016-06-15 NOTE — Progress Notes (Signed)
Recreation Therapy Notes  Date: 06/15/16 Time: 1000 Location: 500 Hall Dayroom  Group Topic: Self-Esteem  Goal Area(s) Addresses:  Patient will identify positive ways to increase self-esteem. Patient will verbalize benefit of increased self-esteem.  Intervention: Blank crest, colored pencils  Activity: Crest of Arms.  Patients were given a blank crest divided into four sections.  In each section, patients were to identify something positive about themselves.  Patients could choose from categories such as biggest accomplishment, proudest moment, best feature, etc or come up their own category.  Patients were to draw a picture to give their words a visual.  Education:  Self-Esteem, Discharge Planning.   Education Outcome: Acknowledges education/In group clarification offered/Needs additional education  Clinical Observations/Feedback: Pt did not attend group.    Markan Cazarez, LRT/CTRS         Greidy Sherard A 06/15/2016 12:57 PM 

## 2016-06-15 NOTE — Progress Notes (Deleted)
Close Observation Note (CO)   Pt at this time is in bed resting with eyes closed. Pt at the time of assessment endorsed moderate anxiety, depression, AH and passive SI; states, "I still hear my sister telling to end it all." Pt attended wrap-up group. Support, encouragement, and safe environment provided. Pt was med compliant. CO staff is present with Pt at this time. CO monitoring continues for Pt's safety. 15-minute safety checks also continues. 

## 2016-06-16 ENCOUNTER — Encounter (HOSPITAL_COMMUNITY): Payer: Self-pay | Admitting: Emergency Medicine

## 2016-06-16 DIAGNOSIS — R0789 Other chest pain: Secondary | ICD-10-CM | POA: Insufficient documentation

## 2016-06-16 DIAGNOSIS — Z79899 Other long term (current) drug therapy: Secondary | ICD-10-CM | POA: Diagnosis not present

## 2016-06-16 DIAGNOSIS — Z91010 Allergy to peanuts: Secondary | ICD-10-CM | POA: Diagnosis not present

## 2016-06-16 DIAGNOSIS — F17200 Nicotine dependence, unspecified, uncomplicated: Secondary | ICD-10-CM | POA: Diagnosis not present

## 2016-06-16 DIAGNOSIS — R079 Chest pain, unspecified: Secondary | ICD-10-CM | POA: Diagnosis present

## 2016-06-16 LAB — CBC
HCT: 34.8 % — ABNORMAL LOW (ref 39.0–52.0)
Hemoglobin: 11.6 g/dL — ABNORMAL LOW (ref 13.0–17.0)
MCH: 25.6 pg — AB (ref 26.0–34.0)
MCHC: 33.3 g/dL (ref 30.0–36.0)
MCV: 76.7 fL — AB (ref 78.0–100.0)
Platelets: 219 10*3/uL (ref 150–400)
RBC: 4.54 MIL/uL (ref 4.22–5.81)
RDW: 16.1 % — ABNORMAL HIGH (ref 11.5–15.5)
WBC: 8.5 10*3/uL (ref 4.0–10.5)

## 2016-06-16 LAB — I-STAT TROPONIN, ED: TROPONIN I, POC: 0 ng/mL (ref 0.00–0.08)

## 2016-06-16 LAB — BASIC METABOLIC PANEL
Anion gap: 10 (ref 5–15)
BUN: 10 mg/dL (ref 6–20)
CALCIUM: 9.3 mg/dL (ref 8.9–10.3)
CHLORIDE: 101 mmol/L (ref 101–111)
CO2: 26 mmol/L (ref 22–32)
Creatinine, Ser: 1.09 mg/dL (ref 0.61–1.24)
GFR calc non Af Amer: >60 mL/min (ref >60)
Glucose, Bld: 109 mg/dL — ABNORMAL HIGH (ref 65–99)
POTASSIUM: 3.6 mmol/L (ref 3.5–5.1)
Sodium: 137 mmol/L (ref 135–145)

## 2016-06-16 NOTE — ED Triage Notes (Signed)
[facility]/10 mid cp and vomiting, pt states he is been vomiting blood today, no fever or chills.

## 2016-06-17 ENCOUNTER — Emergency Department (HOSPITAL_COMMUNITY)
Admission: EM | Admit: 2016-06-17 | Discharge: 2016-06-17 | Disposition: A | Discharge status: Home / Self Care | Payer: Medicaid Other | Attending: Emergency Medicine | Admitting: Emergency Medicine

## 2016-06-17 ENCOUNTER — Emergency Department (HOSPITAL_COMMUNITY): Payer: Medicaid Other

## 2016-06-17 DIAGNOSIS — R0789 Other chest pain: Secondary | ICD-10-CM

## 2016-06-17 LAB — D-DIMER, QUANTITATIVE: D-Dimer, Quant: 0.31 ug/mL-FEU (ref 0.00–0.50)

## 2016-06-17 NOTE — ED Provider Notes (Signed)
TIME SEEN: 3:34 AM By signing my name below, I, Arianna Nassar, attest that this documentation has been prepared under the direction and in the presence of [employer], DO.  Electronically Signed: Octavia Heir, ED Scribe. 06/17/16. 3:45 AM.  CHIEF COMPLAINT:  Chief Complaint  Patient presents with  . Chest Pain  . Emesis     HPI:  HPI Comments: Jesus Bryan is a 26 y.o. male who has a PMhx of seizures presents to the Emergency Department by EMS complaining of moderate, persisting central chest pain x today. Pt describes his pain as a burning sensation. He reports associated hemoptysis with blood-tinged sputum x today only a couple of times and mild shortness of breath. Pt was smoking a cigarette when his chest pain started. He states he feels asymptomatic currently. He denies nausea, vomiting, diaphoresis, and dizziness.  No history of PE, DVT, exogenous estrogen use, recent fractures, surgery, trauma, hospitalization or prolonged travel. No lower extremity swelling or pain. No calf tenderness.  ROS: See HPI Constitutional: no fever  Eyes: no drainage  ENT: no runny nose   Cardiovascular:  (+) chest pain  Resp: no SOB  GI: no vomiting GU: no dysuria Integumentary: no rash  Allergy: no hives  Musculoskeletal: no leg swelling  Neurological: no slurred speech ROS otherwise negative  PAST MEDICAL HISTORY/PAST SURGICAL HISTORY:  Past Medical History:  Diagnosis Date  . Seizure (HCC)   . Seizures (HCC)     MEDICATIONS:  Prior to Admission medications   Medication Sig Start Date End Date Taking? Authorizing Provider  [employer] VITAMIN D 2000 units CAPS Take 2,000 Units by mouth every other day. 12/10/15   Historical Provider, MD  ferrous sulfate 325 (65 FE) MG tablet Take 325 mg by mouth daily. 12/10/15   Historical Provider, MD  levETIRAcetam (KEPPRA) 500 MG tablet Take 2 tablets (1,000 mg total) by mouth 2 (two) times daily. Patient taking differently: Take 500 mg by mouth 3 (three)  times daily.  10/29/15   Tilden Fossa, MD  levETIRAcetam (KEPPRA) 750 MG tablet Take 2 tablets (1,500 mg total) by mouth 2 (two) times daily. 06/14/16   Adonis Brook, NP  OLANZapine (ZYPREXA) 20 MG tablet Take 1 tablet (20 mg total) by mouth at bedtime. 06/14/16   Adonis Brook, NP  OLANZapine (ZYPREXA) 5 MG tablet Take 1 tablet (5 mg total) by mouth at bedtime. 06/14/16   Adonis Brook, NP  traZODone (DESYREL) 100 MG tablet Take 1 tablet (100 mg total) by mouth at bedtime. 06/14/16   Adonis Brook, NP    ALLERGIES:  Allergies  Allergen Reactions  . Peanut-Containing Drug Products Hives  . Peanut-Containing Drug Products     By history - patient denied allergies    SOCIAL HISTORY:  Social History  Substance Use Topics  . Smoking status: Current Every Day Smoker    Packs/day: 1.00    Years: 5.00  . Smokeless tobacco: Never Used     Comment: poor historian, could not give length of smoking history  . Alcohol use 4.2 oz/week    7 Glasses of wine per week     Comment: poor historian    FAMILY HISTORY: No family history on file.  EXAM: BP 121/84 (BP Location: Right Arm)   Pulse 81   Temp 98.7 F (37.1 C) (Oral)   Resp 19   SpO2 98%  CONSTITUTIONAL: Alert and oriented and responds appropriately to questions. Well-appearing; well-nourished HEAD: Normocephalic EYES: Conjunctivae clear, pupils appear equal, EOMI ENT: normal  nose; moist mucous membranes NECK: Supple, no meningismus, no nuchal rigidity, no LAD  CARD: RRR; S1 and S2 appreciated; no murmurs, no clicks, no rubs, no gallops CHEST:  Chest wall is nontender to palpation.  No crepitus, ecchymosis, erythema, warmth, rash or other lesions present.   RESP: Normal chest excursion without splinting or tachypnea; breath sounds clear and equal bilaterally; no wheezes, no rhonchi, no rales, no hypoxia or respiratory distress, speaking full sentences ABD/GI: Normal bowel sounds; non-distended; soft, non-tender, no rebound, no  guarding, no peritoneal signs, no hepatosplenomegaly BACK:  The back appears normal and is non-tender to palpation, there is no CVA tenderness EXT: Normal ROM in all joints; non-tender to palpation; no edema; normal capillary refill; no cyanosis, no calf tenderness or swelling    SKIN: Normal color for age and race; warm; no rash NEURO: Moves all extremities equally PSYCH: The patient's mood and manner are appropriate. Grooming and personal hygiene are appropriate.  MEDICAL DECISION MAKING: Patient here with atypical chest pain. Currently asymptomatic. No risk factors for PE, ACS. Did have some hemoptysis he reports a couple of times with coughing. Suspect bronchitis. No known risk factors for tuberculosis. Currently hemodynamically stable. Labs unremarkable including negative troponin. No fevers or leukocytosis. I have added on a d-dimer which is also negative. Low suspicion for PE or dissection. Doubt ACS. Chest x-ray is clear without infiltrate or edema. I feel patient is safe to be discharged home with outpatient primary care doctor follow-up. Discussed return precautions. He is comfortable with this plan. Suspect this could be viral illness, bronchitis. I do not feel he needs antibiotics.  At this time, I do not feel there is any life-threatening condition present. I have reviewed and discussed all results (EKG, imaging, lab, urine as appropriate) and exam findings with patient/family. I have reviewed nursing notes and appropriate previous records.  I feel the patient is safe to be discharged home without further emergent workup and can continue workup as an outpatient as needed. Discussed usual and customary return precautions. Patient/family verbalize understanding and are comfortable with this plan.  Outpatient follow-up has been provided if needed. All questions have been answered.    EKG Interpretation  Date/Time:  Thursday June 16 2016 22:49:15 EDT Ventricular Rate:  106 PR  Interval:  122 QRS Duration: 86 QT Interval:  332 QTC Calculation: 441 R Axis:   76 Text Interpretation:  Sinus tachycardia Possible Left atrial enlargement Borderline ECG Confirmed by Preeti Winegardner,  DO, Mahin Guardia ([id number]) on 06/17/2016 3:21:32 AM       I personally performed the services described in this documentation, which was scribed in my presence. The recorded information has been reviewed and is accurate.    Layla MawKristen N Angellina Ferdinand, DO 06/17/16 0425

## 2016-06-17 NOTE — Discharge Instructions (Signed)
To find a primary care or specialty doctor please call [phone] or [phone] to access "[facility] Find a Doctor Service." ° °You may also go on the [facility] website at [email or web address] ° °There are also multiple [facility], [facility] practices throughout [place] that are frequently accepting new patients. You may find a clinic that is close to your home and contact them. ° ° [facility] -  °[address] °[place] [address] °[phone] ° ° °[facility] -  °[address] °[place] [address] °[phone] ° ° °[facility] - °[address]  °[place] [address] °[phone] ° ° °

## 2016-07-30 ENCOUNTER — Emergency Department (HOSPITAL_COMMUNITY)
Admission: EM | Admit: 2016-07-30 | Discharge: 2016-07-30 | Disposition: A | Discharge status: Home / Self Care | Payer: Medicaid Other | Attending: Emergency Medicine | Admitting: Emergency Medicine

## 2016-07-30 DIAGNOSIS — Z79899 Other long term (current) drug therapy: Secondary | ICD-10-CM | POA: Diagnosis not present

## 2016-07-30 DIAGNOSIS — F17200 Nicotine dependence, unspecified, uncomplicated: Secondary | ICD-10-CM | POA: Diagnosis not present

## 2016-07-30 DIAGNOSIS — Z91010 Allergy to peanuts: Secondary | ICD-10-CM | POA: Diagnosis not present

## 2016-07-30 DIAGNOSIS — R569 Unspecified convulsions: Secondary | ICD-10-CM | POA: Diagnosis present

## 2016-07-30 LAB — COMPREHENSIVE METABOLIC PANEL
ALK PHOS: 47 U/L (ref 38–126)
ALT: 17 U/L (ref 17–63)
ANION GAP: 7 (ref 5–15)
AST: 26 U/L (ref 15–41)
Albumin: 3.8 g/dL (ref 3.5–5.0)
BILIRUBIN TOTAL: 0.8 mg/dL (ref 0.3–1.2)
BUN: 9 mg/dL (ref 6–20)
CALCIUM: 9 mg/dL (ref 8.9–10.3)
CO2: 25 mmol/L (ref 22–32)
CREATININE: 1.1 mg/dL (ref 0.61–1.24)
Chloride: 106 mmol/L (ref 101–111)
GFR calc non Af Amer: >60 mL/min (ref >60)
Glucose, Bld: 79 mg/dL (ref 65–99)
Potassium: 3.6 mmol/L (ref 3.5–5.1)
Sodium: 138 mmol/L (ref 135–145)
TOTAL PROTEIN: 6.8 g/dL (ref 6.5–8.1)

## 2016-07-30 LAB — CBC WITH DIFFERENTIAL/PLATELET
Basophils Absolute: 0 10*3/uL (ref 0.0–0.1)
Basophils Relative: 0 %
Eosinophils Absolute: 0 10*3/uL (ref 0.0–0.7)
Eosinophils Relative: 0 %
HEMATOCRIT: 36.8 % — AB (ref 39.0–52.0)
Hemoglobin: 12.3 g/dL — ABNORMAL LOW (ref 13.0–17.0)
Lymphocytes Relative: 18 %
Lymphs Abs: 1.6 10*3/uL (ref 0.7–4.0)
MCH: 25.4 pg — AB (ref 26.0–34.0)
MCHC: 33.4 g/dL (ref 30.0–36.0)
MCV: 76 fL — AB (ref 78.0–100.0)
MONOS PCT: 8 %
Monocytes Absolute: 0.7 10*3/uL (ref 0.1–1.0)
NEUTROS ABS: 6.7 10*3/uL (ref 1.7–7.7)
NEUTROS PCT: 74 %
Platelets: 210 10*3/uL (ref 150–400)
RBC: 4.84 MIL/uL (ref 4.22–5.81)
RDW: 16.5 % — ABNORMAL HIGH (ref 11.5–15.5)
WBC: 9.1 10*3/uL (ref 4.0–10.5)

## 2016-07-30 MED ORDER — LORAZEPAM 2 MG/ML IJ SOLN
0.5000 mg | Freq: Once | INTRAMUSCULAR | Status: AC
  Administered 2016-07-30: 0.5 mg via INTRAVENOUS
  Filled 2016-07-30: qty 1

## 2016-07-30 MED ORDER — SODIUM CHLORIDE 0.9 % IV BOLUS (SEPSIS)
1000.0000 mL | Freq: Once | INTRAVENOUS | Status: AC
  Administered 2016-07-30: 1000 mL via INTRAVENOUS

## 2016-07-30 MED ORDER — SODIUM CHLORIDE 0.9 % IV SOLN
500.0000 mg | Freq: Once | INTRAVENOUS | Status: AC
  Administered 2016-07-30: 500 mg via INTRAVENOUS
  Filled 2016-07-30: qty 5

## 2016-07-30 MED ORDER — LEVETIRACETAM 1000 MG PO TABS: 1000.0000 mg | ORAL_TABLET | Freq: Two times a day (BID) | ORAL | 0 refills | Status: DC

## 2016-07-30 MED ORDER — ONDANSETRON HCL 4 MG/2ML IJ SOLN
4.0000 mg | Freq: Once | INTRAMUSCULAR | Status: AC
  Administered 2016-07-30: 4 mg via INTRAVENOUS
  Filled 2016-07-30: qty 2

## 2016-07-30 NOTE — Discharge Instructions (Signed)
Follow-up with your primary care physician.  Continue your Keppra as prescribed.
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
## 2016-07-30 NOTE — ED Triage Notes (Signed)
Pt comes [facility] a recreation facility following a grand mal seizure an hour ago. A friend witnessed the seizure lasting a few minutes. Pt has h/o seizures. Pt not compliant with meds. VSS. No meds given en route.

## 2016-08-05 ENCOUNTER — Encounter (HOSPITAL_COMMUNITY): Payer: Self-pay | Admitting: Emergency Medicine

## 2016-08-05 ENCOUNTER — Emergency Department (HOSPITAL_COMMUNITY)
Admission: EM | Admit: 2016-08-05 | Discharge: 2016-08-05 | Disposition: A | Discharge status: Home / Self Care | Payer: Medicaid Other | Attending: Emergency Medicine | Admitting: Emergency Medicine

## 2016-08-05 DIAGNOSIS — Z9119 Patient's noncompliance with other medical treatment and regimen: Secondary | ICD-10-CM

## 2016-08-05 DIAGNOSIS — G40909 Epilepsy, unspecified, not intractable, without status epilepticus: Secondary | ICD-10-CM | POA: Diagnosis not present

## 2016-08-05 DIAGNOSIS — Z79899 Other long term (current) drug therapy: Secondary | ICD-10-CM | POA: Diagnosis not present

## 2016-08-05 DIAGNOSIS — Z9114 Patient's other noncompliance with medication regimen: Secondary | ICD-10-CM | POA: Insufficient documentation

## 2016-08-05 DIAGNOSIS — R569 Unspecified convulsions: Secondary | ICD-10-CM | POA: Diagnosis present

## 2016-08-05 DIAGNOSIS — F17200 Nicotine dependence, unspecified, uncomplicated: Secondary | ICD-10-CM | POA: Insufficient documentation

## 2016-08-05 DIAGNOSIS — Z91010 Allergy to peanuts: Secondary | ICD-10-CM | POA: Diagnosis not present

## 2016-08-05 DIAGNOSIS — Z91199 Patient's noncompliance with other medical treatment and regimen due to unspecified reason: Secondary | ICD-10-CM

## 2016-08-05 MED ORDER — ACETAMINOPHEN 500 MG PO TABS
1000.0000 mg | ORAL_TABLET | Freq: Once | ORAL | Status: AC
  Administered 2016-08-05: 1000 mg via ORAL
  Filled 2016-08-05: qty 2

## 2016-08-05 MED ORDER — LEVETIRACETAM 500 MG PO TABS
1000.0000 mg | ORAL_TABLET | Freq: Once | ORAL | Status: AC
  Administered 2016-08-05: 1000 mg via ORAL
  Filled 2016-08-05: qty 2
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
## 2016-08-05 NOTE — ED Notes (Signed)
Bed: WA22 Expected date:  Expected time:  Means of arrival:  Comments: EMS-[facility] 

## 2016-08-05 NOTE — ED Triage Notes (Signed)
Per EMS-states he had a seizure at 0200 this am-patient took his Sheralyn BoatmanKepra after seizure-states he then had another seizure in bathroom and fell-states no injuries-post ictal and was combative in route-lost IV, unsure if he is compliant with seizure medication

## 2016-08-22 ENCOUNTER — Encounter (HOSPITAL_COMMUNITY): Payer: Self-pay | Admitting: Emergency Medicine

## 2016-08-22 ENCOUNTER — Emergency Department (HOSPITAL_COMMUNITY)
Admission: EM | Admit: 2016-08-22 | Discharge: 2016-08-22 | Disposition: A | Discharge status: Home / Self Care | Payer: Medicaid Other | Attending: Emergency Medicine | Admitting: Emergency Medicine

## 2016-08-22 DIAGNOSIS — Z91010 Allergy to peanuts: Secondary | ICD-10-CM | POA: Insufficient documentation

## 2016-08-22 DIAGNOSIS — R448 Other symptoms and signs involving general sensations and perceptions: Secondary | ICD-10-CM

## 2016-08-22 DIAGNOSIS — F17200 Nicotine dependence, unspecified, uncomplicated: Secondary | ICD-10-CM | POA: Diagnosis not present

## 2016-08-22 DIAGNOSIS — R6889 Other general symptoms and signs: Secondary | ICD-10-CM

## 2016-08-22 DIAGNOSIS — Z79899 Other long term (current) drug therapy: Secondary | ICD-10-CM | POA: Insufficient documentation

## 2016-08-22 DIAGNOSIS — R208 Other disturbances of skin sensation: Secondary | ICD-10-CM | POA: Diagnosis not present

## 2016-08-22 LAB — CBG MONITORING, ED: Glucose-Capillary: 114 mg/dL — ABNORMAL HIGH (ref 65–99)

## 2016-08-22 NOTE — ED Notes (Signed)
Bed: WTR7 Expected date:  Expected time:  Means of arrival:  Comments: EMS [facility], cold

## 2016-08-22 NOTE — ED Triage Notes (Signed)
[facility] [address]  C/O being cold after waking up from nap and wanted to come to the hospital.  No C/O pain

## 2016-08-22 NOTE — Discharge Instructions (Signed)
Your physical exam was normal today.  Your body temperature and blood glucose were also normal.  It is reassuring that you felt better after eating.  This could mean that your blood sugar may have dipped a little too low causing you to feel cold.   Try to eat or drink fluids at least every 4-6 hours to avoid feeling cold.   Return to the emergency department if you continue to feel cold, or if you feel cold and have cold symptoms, chest pain, shortness of breath, dizziness, nausea, vomiting or abdominal pain, or any rashes.
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
## 2016-09-04 ENCOUNTER — Encounter (HOSPITAL_COMMUNITY): Payer: Self-pay | Admitting: Emergency Medicine

## 2016-09-04 ENCOUNTER — Emergency Department (HOSPITAL_COMMUNITY)
Admission: EM | Admit: 2016-09-04 | Discharge: 2016-09-04 | Disposition: A | Discharge status: Home / Self Care | Payer: Medicaid Other | Attending: Emergency Medicine | Admitting: Emergency Medicine

## 2016-09-04 DIAGNOSIS — R4182 Altered mental status, unspecified: Secondary | ICD-10-CM | POA: Diagnosis present

## 2016-09-04 DIAGNOSIS — F17200 Nicotine dependence, unspecified, uncomplicated: Secondary | ICD-10-CM | POA: Diagnosis not present

## 2016-09-04 DIAGNOSIS — Z79899 Other long term (current) drug therapy: Secondary | ICD-10-CM | POA: Diagnosis not present

## 2016-09-04 DIAGNOSIS — F10120 Alcohol abuse with intoxication, uncomplicated: Secondary | ICD-10-CM | POA: Diagnosis not present

## 2016-09-04 DIAGNOSIS — Z91010 Allergy to peanuts: Secondary | ICD-10-CM | POA: Insufficient documentation

## 2016-09-04 DIAGNOSIS — F10920 Alcohol use, unspecified with intoxication, uncomplicated: Secondary | ICD-10-CM

## 2016-09-04 NOTE — ED Triage Notes (Signed)
Significant other called EMS states pt has been drinking ETOH and has had altered level of conscousiness since 2200 tonight. Responsive to verbal initially that improved to awareness appropriate.
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
## 2016-09-04 NOTE — ED Notes (Signed)
Patient laughing and talking to himself 

## 2016-09-04 NOTE — ED Notes (Signed)
When asked patients name, he did not respond and then sated his name is [name].

## 2016-09-07 ENCOUNTER — Encounter (HOSPITAL_COMMUNITY): Payer: Self-pay | Admitting: Nurse Practitioner

## 2016-09-07 ENCOUNTER — Emergency Department (HOSPITAL_COMMUNITY)
Admission: EM | Admit: 2016-09-07 | Discharge: 2016-09-07 | Disposition: A | Discharge status: Home / Self Care | Payer: Medicaid Other | Attending: Emergency Medicine | Admitting: Emergency Medicine

## 2016-09-07 ENCOUNTER — Other Ambulatory Visit: Payer: Self-pay

## 2016-09-07 DIAGNOSIS — Z91010 Allergy to peanuts: Secondary | ICD-10-CM | POA: Diagnosis not present

## 2016-09-07 DIAGNOSIS — Z79899 Other long term (current) drug therapy: Secondary | ICD-10-CM | POA: Insufficient documentation

## 2016-09-07 DIAGNOSIS — R4020 Unspecified coma: Secondary | ICD-10-CM | POA: Diagnosis not present

## 2016-09-07 DIAGNOSIS — F17210 Nicotine dependence, cigarettes, uncomplicated: Secondary | ICD-10-CM | POA: Insufficient documentation

## 2016-09-07 DIAGNOSIS — R569 Unspecified convulsions: Secondary | ICD-10-CM | POA: Diagnosis present

## 2016-09-07 LAB — I-STAT CHEM 8, ED
BUN: 9 mg/dL (ref 6–20)
CALCIUM ION: 1.2 mmol/L (ref 1.15–1.40)
CHLORIDE: 103 mmol/L (ref 101–111)
CREATININE: 1.2 mg/dL (ref 0.61–1.24)
GLUCOSE: 101 mg/dL — AB (ref 65–99)
HCT: 39 % (ref 39.0–52.0)
Hemoglobin: 13.3 g/dL (ref 13.0–17.0)
POTASSIUM: 4 mmol/L (ref 3.5–5.1)
Sodium: 139 mmol/L (ref 135–145)
TCO2: 28 mmol/L (ref 0–100)

## 2016-09-07 LAB — CBG MONITORING, ED: Glucose-Capillary: 97 mg/dL (ref 65–99)

## 2016-09-07 MED ORDER — LEVETIRACETAM 1000 MG PO TABS: 1000.0000 mg | ORAL_TABLET | Freq: Two times a day (BID) | ORAL | 3 refills | Status: DC

## 2016-09-07 MED ORDER — SODIUM CHLORIDE 0.9 % IV BOLUS (SEPSIS)
1000.0000 mL | Freq: Once | INTRAVENOUS | Status: AC
  Administered 2016-09-07: 1000 mL via INTRAVENOUS

## 2016-09-07 NOTE — ED Notes (Signed)
Pt states "I've had a little bit of beer today"  Would not clarify any further

## 2016-09-07 NOTE — ED Notes (Signed)
Pt was mumbling and when asked could only state his first name

## 2016-09-07 NOTE — ED Triage Notes (Addendum)
Pt presents with c/o fatigue. He reports he fell asleep today after being outside in the heat this afternoon. His fiancee reports he had seizure like activity for about 2 minutes at that time and is non-compliant with his seizure medications. She is also concerned that the patient may be dehydrated from being outdoors in the heat today. The patient feels like he did not have a seizure and denies any complaints now.

## 2016-09-07 NOTE — Discharge Instructions (Signed)
Please take your seizure medications as prescribed. Please return without fail for worsening symptoms, including confusion, recurrent loss of consciousness, or any other symptoms concerning to you
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
## 2016-09-20 ENCOUNTER — Telehealth: Payer: Self-pay | Admitting: Neurology

## 2016-09-20 ENCOUNTER — Ambulatory Visit: Payer: Medicaid Other | Admitting: Neurology

## 2016-09-20 ENCOUNTER — Encounter: Payer: Self-pay | Admitting: Neurology

## 2016-09-20 NOTE — Telephone Encounter (Signed)
This is the second new patient no show for this patient.  The patient will be discharged from our practice. 

## 2016-11-20 ENCOUNTER — Emergency Department
Admission: EM | Admit: 2016-11-20 | Discharge: 2016-11-20 | Attending: Emergency Medicine | Admitting: Emergency Medicine

## 2016-11-20 ENCOUNTER — Encounter: Payer: Self-pay | Admitting: Emergency Medicine

## 2016-11-20 DIAGNOSIS — Z79899 Other long term (current) drug therapy: Secondary | ICD-10-CM | POA: Insufficient documentation

## 2016-11-20 DIAGNOSIS — R569 Unspecified convulsions: Secondary | ICD-10-CM

## 2016-11-20 DIAGNOSIS — F17200 Nicotine dependence, unspecified, uncomplicated: Secondary | ICD-10-CM | POA: Insufficient documentation

## 2016-11-20 LAB — CBC WITH DIFFERENTIAL/PLATELET
BASOS PCT: 1 %
Basophils Absolute: 0 10*3/uL (ref 0–0.1)
EOS PCT: 1 %
Eosinophils Absolute: 0 10*3/uL (ref 0–0.7)
HCT: 41.8 % (ref 40.0–52.0)
HEMOGLOBIN: 13.7 g/dL (ref 13.0–18.0)
LYMPHS ABS: 1.4 10*3/uL (ref 1.0–3.6)
Lymphocytes Relative: 24 %
MCH: 27.4 pg (ref 26.0–34.0)
MCHC: 32.9 g/dL (ref 32.0–36.0)
MCV: 83.3 fL (ref 80.0–100.0)
MONO ABS: 0.7 10*3/uL (ref 0.2–1.0)
MONOS PCT: 12 %
NEUTROS PCT: 62 %
Neutro Abs: 3.6 10*3/uL (ref 1.4–6.5)
PLATELETS: 209 10*3/uL (ref 150–440)
RBC: 5.01 MIL/uL (ref 4.40–5.90)
RDW: 16.7 % — AB (ref 11.5–14.5)
WBC: 5.7 10*3/uL (ref 3.8–10.6)

## 2016-11-20 LAB — BASIC METABOLIC PANEL
Anion gap: 8 (ref 5–15)
BUN: 10 mg/dL (ref 6–20)
CHLORIDE: 105 mmol/L (ref 101–111)
CO2: 25 mmol/L (ref 22–32)
CREATININE: 1.21 mg/dL (ref 0.61–1.24)
Calcium: 9.2 mg/dL (ref 8.9–10.3)
GFR calc non Af Amer: >60 mL/min (ref >60)
Glucose, Bld: 98 mg/dL (ref 65–99)
Potassium: 4.3 mmol/L (ref 3.5–5.1)
Sodium: 138 mmol/L (ref 135–145)

## 2016-11-20 MED ORDER — LEVETIRACETAM 500 MG/5ML IV SOLN
1000.0000 mg | Freq: Once | INTRAVENOUS | Status: AC
  Administered 2016-11-20: 1000 mg via INTRAVENOUS
  Filled 2016-11-20: qty 10

## 2016-11-20 MED ORDER — LEVETIRACETAM 500 MG PO TABS: 1000.0000 mg | ORAL_TABLET | Freq: Two times a day (BID) | ORAL | 1 refills | Status: DC
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
## 2016-11-20 NOTE — Discharge Instructions (Signed)
As we discussed it is very important that you do not drive, go up on roofs, swim, or put yourself in any situation that might be dangerous for you or others if you were to have another seizure until you are cleared by a neurologist. Please seek medical attention for any high fevers, chest pain, shortness of breath, change in behavior, persistent vomiting, bloody stool or any other new or concerning symptoms. ° °
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
## 2017-02-12 ENCOUNTER — Other Ambulatory Visit: Payer: Self-pay

## 2017-02-12 ENCOUNTER — Encounter: Payer: Self-pay | Admitting: Emergency Medicine

## 2017-02-12 ENCOUNTER — Emergency Department
Admission: EM | Admit: 2017-02-12 | Discharge: 2017-02-12 | Disposition: A | Discharge status: Home / Self Care | Attending: Emergency Medicine | Admitting: Emergency Medicine

## 2017-02-12 DIAGNOSIS — Z79899 Other long term (current) drug therapy: Secondary | ICD-10-CM | POA: Diagnosis not present

## 2017-02-12 DIAGNOSIS — Z91010 Allergy to peanuts: Secondary | ICD-10-CM | POA: Insufficient documentation

## 2017-02-12 DIAGNOSIS — F17200 Nicotine dependence, unspecified, uncomplicated: Secondary | ICD-10-CM | POA: Insufficient documentation

## 2017-02-12 DIAGNOSIS — R569 Unspecified convulsions: Secondary | ICD-10-CM | POA: Diagnosis not present

## 2017-02-12 DIAGNOSIS — R1110 Vomiting, unspecified: Secondary | ICD-10-CM | POA: Insufficient documentation

## 2017-02-12 LAB — CBC WITH DIFFERENTIAL/PLATELET
BASOS ABS: 0 10*3/uL (ref 0–0.1)
BASOS PCT: 1 %
EOS PCT: 0 %
Eosinophils Absolute: 0 10*3/uL (ref 0–0.7)
HCT: 39.2 % — ABNORMAL LOW (ref 40.0–52.0)
Hemoglobin: 13.1 g/dL (ref 13.0–18.0)
Lymphocytes Relative: 13 %
Lymphs Abs: 0.6 10*3/uL — ABNORMAL LOW (ref 1.0–3.6)
MCH: 28.5 pg (ref 26.0–34.0)
MCHC: 33.3 g/dL (ref 32.0–36.0)
MCV: 85.4 fL (ref 80.0–100.0)
Monocytes Absolute: 0.4 10*3/uL (ref 0.2–1.0)
Monocytes Relative: 9 %
Neutro Abs: 3.7 10*3/uL (ref 1.4–6.5)
Neutrophils Relative %: 77 %
PLATELETS: 199 10*3/uL (ref 150–440)
RBC: 4.59 MIL/uL (ref 4.40–5.90)
RDW: 16.4 % — AB (ref 11.5–14.5)
WBC: 4.8 10*3/uL (ref 3.8–10.6)

## 2017-02-12 LAB — COMPREHENSIVE METABOLIC PANEL
ALT: 30 U/L (ref 17–63)
AST: 39 U/L (ref 15–41)
Albumin: 3.9 g/dL (ref 3.5–5.0)
Alkaline Phosphatase: 51 U/L (ref 38–126)
Anion gap: 11 (ref 5–15)
BILIRUBIN TOTAL: 0.7 mg/dL (ref 0.3–1.2)
BUN: 12 mg/dL (ref 6–20)
CO2: 23 mmol/L (ref 22–32)
CREATININE: 1.14 mg/dL (ref 0.61–1.24)
Calcium: 8.7 mg/dL — ABNORMAL LOW (ref 8.9–10.3)
Chloride: 103 mmol/L (ref 101–111)
GFR calc Af Amer: >60 mL/min (ref >60)
Glucose, Bld: 88 mg/dL (ref 65–99)
POTASSIUM: 3.8 mmol/L (ref 3.5–5.1)
Sodium: 137 mmol/L (ref 135–145)
TOTAL PROTEIN: 7.3 g/dL (ref 6.5–8.1)

## 2017-02-12 LAB — URINALYSIS, COMPLETE (UACMP) WITH MICROSCOPIC
Bacteria, UA: NONE SEEN
Bilirubin Urine: NEGATIVE
Glucose, UA: NEGATIVE mg/dL
Hgb urine dipstick: NEGATIVE
Ketones, ur: NEGATIVE mg/dL
Leukocytes, UA: NEGATIVE
NITRITE: NEGATIVE
Protein, ur: NEGATIVE mg/dL
SPECIFIC GRAVITY, URINE: 1.011 (ref 1.005–1.030)
Squamous Epithelial / LPF: NONE SEEN
pH: 6 (ref 5.0–8.0)

## 2017-02-12 LAB — URINE DRUG SCREEN, QUALITATIVE (ARMC ONLY)
Amphetamines, Ur Screen: NOT DETECTED
Barbiturates, Ur Screen: NOT DETECTED
Benzodiazepine, Ur Scrn: NOT DETECTED
COCAINE METABOLITE, UR ~~LOC~~: NOT DETECTED
Cannabinoid 50 Ng, Ur ~~LOC~~: NOT DETECTED
MDMA (ECSTASY) UR SCREEN: NOT DETECTED
Methadone Scn, Ur: NOT DETECTED
Opiate, Ur Screen: NOT DETECTED
Phencyclidine (PCP) Ur S: NOT DETECTED
TRICYCLIC, UR SCREEN: NOT DETECTED

## 2017-02-12 LAB — ETHANOL

## 2017-02-12 MED ORDER — SODIUM CHLORIDE 0.9 % IV BOLUS (SEPSIS)
1000.0000 mL | Freq: Once | INTRAVENOUS | Status: AC
  Administered 2017-02-12: 1000 mL via INTRAVENOUS

## 2017-02-12 NOTE — ED Triage Notes (Signed)
[place] [facility] [place] [facility] c/o abd pain and headache. Officer states, they were concerned about patient due to him "falling out " this morning and questionable seizure activity. Pt takes keppra .

## 2017-02-12 NOTE — ED Notes (Signed)
Reminded pt that a urine sample was needed
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
## 2017-02-12 NOTE — Discharge Instructions (Addendum)
You are evaluated after 2 seizures in the past 24 hours, and although no certain cause was found, your exam and evaluation are overall reassuring in the emergency department today.  I suspect if you've had some vomiting this may be a virus and contributing to lowered seizure threshold.  After discussion with neurologist, recommended changing the way you take your medications to provide better coverage throughout the day, discontinue 1500 mg twice daily and start 1000 mg every 8 hours.  Return to the emerge department immediately for any new or worsening condition including fever, confusion altered mental status, seizure lasting longer than 10 minutes, or not going back to baseline, weakness, numbness, or any other symptoms concerning to you.

## 2017-02-12 NOTE — ED Notes (Signed)
Pt verbalizes d/c teaching along with officer. [facility] officer, Pt in NAD at time of d/c, VS stable, ambulatory

## 2017-02-12 NOTE — ED Notes (Signed)
Pt assisted to side of bed to void in urinal

## 2018-10-08 ENCOUNTER — Emergency Department: Payer: Medicaid Other

## 2018-10-08 ENCOUNTER — Encounter: Payer: Self-pay | Admitting: *Deleted

## 2018-10-08 ENCOUNTER — Emergency Department
Admission: EM | Admit: 2018-10-08 | Discharge: 2018-10-09 | Disposition: A | Discharge status: Home / Self Care | Payer: Medicaid Other | Attending: Emergency Medicine | Admitting: Emergency Medicine

## 2018-10-08 ENCOUNTER — Other Ambulatory Visit: Payer: Self-pay

## 2018-10-08 DIAGNOSIS — F17200 Nicotine dependence, unspecified, uncomplicated: Secondary | ICD-10-CM | POA: Diagnosis not present

## 2018-10-08 DIAGNOSIS — R509 Fever, unspecified: Secondary | ICD-10-CM | POA: Insufficient documentation

## 2018-10-08 DIAGNOSIS — Z20828 Contact with and (suspected) exposure to other viral communicable diseases: Secondary | ICD-10-CM | POA: Insufficient documentation

## 2018-10-08 LAB — COMPREHENSIVE METABOLIC PANEL
ALT: 21 U/L (ref 0–44)
AST: 23 U/L (ref 15–41)
Albumin: 4 g/dL (ref 3.5–5.0)
Alkaline Phosphatase: 32 U/L — ABNORMAL LOW (ref 38–126)
Anion gap: 9 (ref 5–15)
BUN: 18 mg/dL (ref 6–20)
CO2: 22 mmol/L (ref 22–32)
Calcium: 8.6 mg/dL — ABNORMAL LOW (ref 8.9–10.3)
Chloride: 106 mmol/L (ref 98–111)
Creatinine, Ser: 1.31 mg/dL — ABNORMAL HIGH (ref 0.61–1.24)
GFR calc Af Amer: >60 mL/min (ref >60)
GFR calc non Af Amer: >60 mL/min (ref >60)
Glucose, Bld: 99 mg/dL (ref 70–99)
Potassium: 4.2 mmol/L (ref 3.5–5.1)
Sodium: 137 mmol/L (ref 135–145)
Total Bilirubin: 0.9 mg/dL (ref 0.3–1.2)
Total Protein: 6.8 g/dL (ref 6.5–8.1)

## 2018-10-08 LAB — URINALYSIS, ROUTINE W REFLEX MICROSCOPIC
Bilirubin Urine: NEGATIVE
Glucose, UA: NEGATIVE mg/dL
Hgb urine dipstick: NEGATIVE
Ketones, ur: NEGATIVE mg/dL
Leukocytes,Ua: NEGATIVE
Nitrite: NEGATIVE
Protein, ur: NEGATIVE mg/dL
Specific Gravity, Urine: 1.016 (ref 1.005–1.030)
pH: 5 (ref 5.0–8.0)

## 2018-10-08 LAB — URINE DRUG SCREEN, QUALITATIVE (ARMC ONLY)
Amphetamines, Ur Screen: NOT DETECTED
Barbiturates, Ur Screen: NOT DETECTED
Benzodiazepine, Ur Scrn: NOT DETECTED
Cannabinoid 50 Ng, Ur ~~LOC~~: NOT DETECTED
Cocaine Metabolite,Ur ~~LOC~~: NOT DETECTED
MDMA (Ecstasy)Ur Screen: NOT DETECTED
Methadone Scn, Ur: NOT DETECTED
Opiate, Ur Screen: NOT DETECTED
Phencyclidine (PCP) Ur S: NOT DETECTED
Tricyclic, Ur Screen: NOT DETECTED

## 2018-10-08 LAB — CBC WITH DIFFERENTIAL/PLATELET
Abs Immature Granulocytes: 0.05 10*3/uL (ref 0.00–0.07)
Basophils Absolute: 0 10*3/uL (ref 0.0–0.1)
Basophils Relative: 0 %
Eosinophils Absolute: 0 10*3/uL (ref 0.0–0.5)
Eosinophils Relative: 0 %
HCT: 33.5 % — ABNORMAL LOW (ref 39.0–52.0)
Hemoglobin: 11.6 g/dL — ABNORMAL LOW (ref 13.0–17.0)
Immature Granulocytes: 1 %
Lymphocytes Relative: 13 %
Lymphs Abs: 1.1 10*3/uL (ref 0.7–4.0)
MCH: 30.5 pg (ref 26.0–34.0)
MCHC: 34.6 g/dL (ref 30.0–36.0)
MCV: 88.2 fL (ref 80.0–100.0)
Monocytes Absolute: 0.9 10*3/uL (ref 0.1–1.0)
Monocytes Relative: 11 %
Neutro Abs: 6.3 10*3/uL (ref 1.7–7.7)
Neutrophils Relative %: 75 %
Platelets: 144 10*3/uL — ABNORMAL LOW (ref 150–400)
RBC: 3.8 MIL/uL — ABNORMAL LOW (ref 4.22–5.81)
RDW: 13.9 % (ref 11.5–15.5)
WBC: 8.3 10*3/uL (ref 4.0–10.5)
nRBC: 0 % (ref 0.0–0.2)

## 2018-10-08 LAB — SARS CORONAVIRUS 2 BY RT PCR (HOSPITAL ORDER, PERFORMED IN ~~LOC~~ HOSPITAL LAB): SARS Coronavirus 2: NEGATIVE

## 2018-10-08 LAB — LACTIC ACID, PLASMA: Lactic Acid, Venous: 1.9 mmol/L (ref 0.5–1.9)

## 2018-10-08 MED ORDER — SODIUM CHLORIDE 0.9 % IV SOLN
Freq: Once | INTRAVENOUS | Status: AC
  Administered 2018-10-08: 22:00:00 via INTRAVENOUS

## 2018-10-08 MED ORDER — LEVETIRACETAM 500 MG PO TABS
1000.0000 mg | ORAL_TABLET | Freq: Once | ORAL | Status: AC
  Administered 2018-10-08: 1000 mg via ORAL
  Filled 2018-10-08: qty 2
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
## 2018-10-08 NOTE — ED Notes (Signed)
ED Provider at bedside. 

## 2018-10-08 NOTE — ED Notes (Signed)
Pt brought in via ems.  Pt was discharged from jail today.   Pt has a fever.  No cough  Former smoker.  Iv in place.  Sinus tach on monitor.

## 2018-10-08 NOTE — ED Triage Notes (Signed)
Pt brought in via ems with a fever. Pt was discharged from jail today after 2 yr incarceration.   Intermittent chest pain and sob.  Pt alert

## 2018-10-09 ENCOUNTER — Encounter: Payer: Self-pay | Admitting: Emergency Medicine

## 2018-10-09 ENCOUNTER — Emergency Department
Admission: EM | Admit: 2018-10-09 | Discharge: 2018-10-10 | Disposition: A | Discharge status: Home / Self Care | Payer: Medicaid Other | Source: Home / Self Care | Attending: Emergency Medicine | Admitting: Emergency Medicine

## 2018-10-09 ENCOUNTER — Other Ambulatory Visit: Payer: Self-pay

## 2018-10-09 DIAGNOSIS — Z20828 Contact with and (suspected) exposure to other viral communicable diseases: Secondary | ICD-10-CM | POA: Insufficient documentation

## 2018-10-09 DIAGNOSIS — F17200 Nicotine dependence, unspecified, uncomplicated: Secondary | ICD-10-CM | POA: Insufficient documentation

## 2018-10-09 DIAGNOSIS — Z91010 Allergy to peanuts: Secondary | ICD-10-CM | POA: Insufficient documentation

## 2018-10-09 DIAGNOSIS — R110 Nausea: Secondary | ICD-10-CM | POA: Insufficient documentation

## 2018-10-09 LAB — BASIC METABOLIC PANEL
Anion gap: 7 (ref 5–15)
BUN: 16 mg/dL (ref 6–20)
CO2: 22 mmol/L (ref 22–32)
Calcium: 8.7 mg/dL — ABNORMAL LOW (ref 8.9–10.3)
Chloride: 111 mmol/L (ref 98–111)
Creatinine, Ser: 1.06 mg/dL (ref 0.61–1.24)
GFR calc Af Amer: >60 mL/min (ref >60)
GFR calc non Af Amer: >60 mL/min (ref >60)
Glucose, Bld: 142 mg/dL — ABNORMAL HIGH (ref 70–99)
Potassium: 3.6 mmol/L (ref 3.5–5.1)
Sodium: 140 mmol/L (ref 135–145)

## 2018-10-09 LAB — CBC
HCT: 34.8 % — ABNORMAL LOW (ref 39.0–52.0)
Hemoglobin: 11.7 g/dL — ABNORMAL LOW (ref 13.0–17.0)
MCH: 30.7 pg (ref 26.0–34.0)
MCHC: 33.6 g/dL (ref 30.0–36.0)
MCV: 91.3 fL (ref 80.0–100.0)
Platelets: 146 10*3/uL — ABNORMAL LOW (ref 150–400)
RBC: 3.81 MIL/uL — ABNORMAL LOW (ref 4.22–5.81)
RDW: 14.2 % (ref 11.5–15.5)
WBC: 8.1 10*3/uL (ref 4.0–10.5)
nRBC: 0 % (ref 0.0–0.2)

## 2018-10-09 LAB — ETHANOL: Alcohol, Ethyl (B): <10 mg/dL (ref <10)

## 2018-10-09 MED ORDER — LEVETIRACETAM 500 MG PO TABS
1000.0000 mg | ORAL_TABLET | Freq: Once | ORAL | Status: AC
  Administered 2018-10-09: 1000 mg via ORAL
  Filled 2018-10-09: qty 2

## 2018-10-09 MED ORDER — ONDANSETRON 4 MG PO TBDP
4.0000 mg | ORAL_TABLET | Freq: Once | ORAL | Status: AC
  Administered 2018-10-09: 4 mg via ORAL
  Filled 2018-10-09: qty 1

## 2018-10-09 MED ORDER — ONDANSETRON 4 MG PO TBDP
ORAL_TABLET | ORAL | Status: AC
  Administered 2018-10-09: 4 mg via ORAL
  Filled 2018-10-09: qty 1

## 2018-10-09 MED ORDER — LEVETIRACETAM 500 MG PO TABS: 1000.0000 mg | ORAL_TABLET | Freq: Two times a day (BID) | ORAL | 1 refills | Status: DC

## 2018-10-09 MED ORDER — ONDANSETRON HCL 4 MG/2ML IJ SOLN
4.0000 mg | Freq: Once | INTRAMUSCULAR | Status: DC
  Filled 2018-10-09: qty 2

## 2018-10-09 NOTE — Discharge Instructions (Addendum)
Please seek medical attention for any high fevers, chest pain, shortness of breath, change in behavior, persistent vomiting, bloody stool or any other new or concerning symptoms.  

## 2018-10-09 NOTE — ED Triage Notes (Signed)
PT arrives via ems with complaints of weakness & fever. Pt seen & d/c yesterday with same complaints. Pt denies new complaints or worsening of symptoms, pt states "I just don't feel better." Pt appears in NAD

## 2018-10-09 NOTE — ED Provider Notes (Signed)
Patient's blood work without concerning leukocytosis or electrolyte abnormality. Heart rate has improved. Discussed work up findings with the patient. Will plan on discharging. Discussed importance of pcp follow up.   Nance Pear, MD 10/09/18 [phone]
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
## 2018-10-09 NOTE — ED Notes (Signed)
Pt alert.  Iv fluids infused.  Sinus on monitor.

## 2018-10-09 NOTE — ED Notes (Signed)
md in with again.

## 2018-10-10 LAB — SARS CORONAVIRUS 2 BY RT PCR (HOSPITAL ORDER, PERFORMED IN ~~LOC~~ HOSPITAL LAB): SARS Coronavirus 2: NEGATIVE

## 2018-10-10 MED ORDER — LEVETIRACETAM 1000 MG PO TABS: 1000.0000 mg | ORAL_TABLET | Freq: Two times a day (BID) | ORAL | 2 refills | Status: DC

## 2018-10-10 NOTE — Discharge Instructions (Signed)
You have been seen in the emergency department tonight.  Your work-up as shown normal results including a negative coronavirus test.

## 2018-10-11 ENCOUNTER — Inpatient Hospital Stay: Payer: Medicaid Other

## 2018-10-11 ENCOUNTER — Inpatient Hospital Stay
Admission: EM | Admit: 2018-10-11 | Discharge: 2018-10-14 | DRG: 101 | Disposition: A | Discharge status: Home / Self Care | Payer: Medicaid Other | Attending: Internal Medicine | Admitting: Internal Medicine

## 2018-10-11 ENCOUNTER — Encounter: Payer: Self-pay | Admitting: Emergency Medicine

## 2018-10-11 ENCOUNTER — Other Ambulatory Visit: Payer: Self-pay

## 2018-10-11 DIAGNOSIS — Z590 Homelessness: Secondary | ICD-10-CM | POA: Diagnosis not present

## 2018-10-11 DIAGNOSIS — F17200 Nicotine dependence, unspecified, uncomplicated: Secondary | ICD-10-CM | POA: Diagnosis present

## 2018-10-11 DIAGNOSIS — F250 Schizoaffective disorder, bipolar type: Secondary | ICD-10-CM | POA: Diagnosis present

## 2018-10-11 DIAGNOSIS — G40419 Other generalized epilepsy and epileptic syndromes, intractable, without status epilepticus: Secondary | ICD-10-CM | POA: Diagnosis not present

## 2018-10-11 DIAGNOSIS — G40309 Generalized idiopathic epilepsy and epileptic syndromes, not intractable, without status epilepticus: Principal | ICD-10-CM | POA: Diagnosis present

## 2018-10-11 DIAGNOSIS — R569 Unspecified convulsions: Secondary | ICD-10-CM | POA: Diagnosis not present

## 2018-10-11 DIAGNOSIS — Z91010 Allergy to peanuts: Secondary | ICD-10-CM | POA: Diagnosis not present

## 2018-10-11 DIAGNOSIS — D72829 Elevated white blood cell count, unspecified: Secondary | ICD-10-CM

## 2018-10-11 DIAGNOSIS — R4182 Altered mental status, unspecified: Secondary | ICD-10-CM | POA: Diagnosis present

## 2018-10-11 DIAGNOSIS — Z9114 Patient's other noncompliance with medication regimen: Secondary | ICD-10-CM | POA: Diagnosis not present

## 2018-10-11 LAB — CBC WITH DIFFERENTIAL/PLATELET
Abs Immature Granulocytes: 0.1 10*3/uL — ABNORMAL HIGH (ref 0.00–0.07)
Basophils Absolute: 0.1 10*3/uL (ref 0.0–0.1)
Basophils Relative: 1 %
Eosinophils Absolute: 0 10*3/uL (ref 0.0–0.5)
Eosinophils Relative: 0 %
HCT: 37.9 % — ABNORMAL LOW (ref 39.0–52.0)
Hemoglobin: 12.3 g/dL — ABNORMAL LOW (ref 13.0–17.0)
Immature Granulocytes: 1 %
Lymphocytes Relative: 36 %
Lymphs Abs: 5.3 10*3/uL — ABNORMAL HIGH (ref 0.7–4.0)
MCH: 30.4 pg (ref 26.0–34.0)
MCHC: 32.5 g/dL (ref 30.0–36.0)
MCV: 93.8 fL (ref 80.0–100.0)
Monocytes Absolute: 1.6 10*3/uL — ABNORMAL HIGH (ref 0.1–1.0)
Monocytes Relative: 11 %
Neutro Abs: 7.5 10*3/uL (ref 1.7–7.7)
Neutrophils Relative %: 51 %
Platelets: 196 10*3/uL (ref 150–400)
RBC: 4.04 MIL/uL — ABNORMAL LOW (ref 4.22–5.81)
RDW: 13.7 % (ref 11.5–15.5)
WBC: 14.6 10*3/uL — ABNORMAL HIGH (ref 4.0–10.5)
nRBC: 0 % (ref 0.0–0.2)

## 2018-10-11 LAB — URINALYSIS, COMPLETE (UACMP) WITH MICROSCOPIC
Bacteria, UA: NONE SEEN
Bilirubin Urine: NEGATIVE
Glucose, UA: NEGATIVE mg/dL
Hgb urine dipstick: NEGATIVE
Ketones, ur: NEGATIVE mg/dL
Leukocytes,Ua: NEGATIVE
Nitrite: NEGATIVE
Protein, ur: 30 mg/dL — AB
Specific Gravity, Urine: 1.018 (ref 1.005–1.030)
Squamous Epithelial / LPF: NONE SEEN (ref 0–5)
pH: 5 (ref 5.0–8.0)

## 2018-10-11 LAB — COMPREHENSIVE METABOLIC PANEL
ALT: 22 U/L (ref 0–44)
AST: 54 U/L — ABNORMAL HIGH (ref 15–41)
Albumin: 4.4 g/dL (ref 3.5–5.0)
Alkaline Phosphatase: 37 U/L — ABNORMAL LOW (ref 38–126)
Anion gap: 25 — ABNORMAL HIGH (ref 5–15)
BUN: 16 mg/dL (ref 6–20)
CO2: 9 mmol/L — ABNORMAL LOW (ref 22–32)
Calcium: 9 mg/dL (ref 8.9–10.3)
Chloride: 107 mmol/L (ref 98–111)
Creatinine, Ser: 1.13 mg/dL (ref 0.61–1.24)
GFR calc Af Amer: >60 mL/min (ref >60)
GFR calc non Af Amer: >60 mL/min (ref >60)
Glucose, Bld: 212 mg/dL — ABNORMAL HIGH (ref 70–99)
Potassium: 3.6 mmol/L (ref 3.5–5.1)
Sodium: 141 mmol/L (ref 135–145)
Total Bilirubin: 0.7 mg/dL (ref 0.3–1.2)
Total Protein: 8.2 g/dL — ABNORMAL HIGH (ref 6.5–8.1)

## 2018-10-11 LAB — TSH: TSH: 1.632 u[IU]/mL (ref 0.350–4.500)

## 2018-10-11 MED ORDER — LORAZEPAM 2 MG/ML IJ SOLN
INTRAMUSCULAR | Status: AC
  Administered 2018-10-11: 1 mg via INTRAVENOUS
  Filled 2018-10-11: qty 1

## 2018-10-11 MED ORDER — GADOBUTROL 1 MMOL/ML IV SOLN
7.5000 mL | Freq: Once | INTRAVENOUS | Status: AC | PRN
  Administered 2018-10-11: 7.5 mL via INTRAVENOUS

## 2018-10-11 MED ORDER — GADOBUTROL 1 MMOL/ML IV SOLN: 6.0000 mL | Freq: Once | INTRAVENOUS | Status: DC | PRN

## 2018-10-11 MED ORDER — ENOXAPARIN SODIUM 40 MG/0.4ML ~~LOC~~ SOLN
40.0000 mg | SUBCUTANEOUS | Status: DC
  Administered 2018-10-11 – 2018-10-13 (×3): 40 mg via SUBCUTANEOUS
  Filled 2018-10-11 (×3): qty 0.4

## 2018-10-11 MED ORDER — MORPHINE SULFATE (PF) 2 MG/ML IV SOLN: 2.0000 mg | INTRAVENOUS | Status: DC | PRN

## 2018-10-11 MED ORDER — VALPROATE SODIUM 500 MG/5ML IV SOLN
500.0000 mg | Freq: Two times a day (BID) | INTRAVENOUS | Status: DC
  Administered 2018-10-11 – 2018-10-14 (×6): 500 mg via INTRAVENOUS
  Filled 2018-10-11 (×8): qty 5

## 2018-10-11 MED ORDER — SODIUM CHLORIDE 0.9 % IV SOLN
75.0000 mL/h | INTRAVENOUS | Status: DC
  Administered 2018-10-11 – 2018-10-14 (×4): 75 mL/h via INTRAVENOUS

## 2018-10-11 MED ORDER — LEVETIRACETAM IN NACL 1000 MG/100ML IV SOLN
1000.0000 mg | Freq: Once | INTRAVENOUS | Status: AC
  Administered 2018-10-11: 1000 mg via INTRAVENOUS
  Filled 2018-10-11: qty 100

## 2018-10-11 MED ORDER — ACETAMINOPHEN 325 MG PO TABS
650.0000 mg | ORAL_TABLET | Freq: Four times a day (QID) | ORAL | Status: DC | PRN
  Administered 2018-10-11 – 2018-10-13 (×3): 650 mg via ORAL
  Filled 2018-10-11 (×3): qty 2

## 2018-10-11 MED ORDER — LORAZEPAM 2 MG/ML IJ SOLN: 1.0000 mg | INTRAMUSCULAR | Status: DC | PRN

## 2018-10-11 MED ORDER — LORAZEPAM 2 MG/ML IJ SOLN
1.0000 mg | Freq: Once | INTRAMUSCULAR | Status: AC
  Administered 2018-10-11: 1 mg via INTRAVENOUS

## 2018-10-11 NOTE — ED Notes (Signed)
ED TO INPATIENT HANDOFF REPORT  ED Nurse Name and Phone #:   S Name/Age/Gender Ivy Lynn 28 y.o. male Room/Bed: ED04A/ED04A  Code Status   Code Status: Prior  Home/SNF/Other Home Patient oriented to: self and place Is this baseline? No   Triage Complete: Triage complete  Chief Complaint seizure  Triage Note Pt was seen yesterday for seizures - still has rx in his bag. Was getting on bus to [place] and had seizure. Pt is postical at this time   Allergies Allergies  Allergen Reactions  . Peanut-Containing Drug Products Hives  . Peanut-Containing Drug Products     By history - patient denied allergies    Level of Care/Admitting Diagnosis ED Disposition    ED Disposition Condition [place]: [place] [100120]  Level of Care: Med-Surg [16]  Covid Evaluation: Confirmed COVID Negative  Diagnosis: Seizure [facility]) [518841]  Admitting Physician: Eula Flax  Attending Physician: Rufina Falco ACHIENG [phone]  Estimated length of stay: past midnight tomorrow  Certification:: I certify this patient will need inpatient services for at least 2 midnights  PT Class (Do Not Modify): Inpatient [101]  PT Acc Code (Do Not Modify): Private [1]       B Medical/Surgery History Past Medical History:  Diagnosis Date  . Seizure ([place])   . Seizures ([place])    Past Surgical History:  Procedure Laterality Date  . 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  LEFT HAND REPAIR OF SMALL AND RING FINGER EXTENSOR TENDONS.;  Surgeon: Tennis Must, MD;  Location: [place];  Service: Orthopedics;  Laterality: Bilateral;  . NO PAST SURGERIES       A IV Location/Drains/Wounds Patient Lines/Drains/Airways Status   Active Line/Drains/Airways    Name:   Placement date:   Placement  time:   Site:   Days:   Peripheral IV 10/11/18 Left Antecubital   10/11/18    1204    Antecubital   less than 1   Incision (Closed) 10/17/13 Hand Right   10/17/13    1725     1820   Incision (Closed) 10/17/13 Hand Left   10/17/13    1725     1820   Wound / Incision (Open or Dehisced) 10/17/13 Laceration Hand Right large laceration between 1st and 2nd digits right hand   10/17/13    -    Hand   1820   Wound / Incision (Open or Dehisced) 10/22/13  Hand Right Sutures intact. Minimal drainage on old dressing.   10/22/13    0320    Hand   1815   Wound / Incision (Open or Dehisced) 10/22/13 Laceration Hand Left sutures intact, minimal drainage on old dressing.   10/22/13    0320    Hand   1815          Intake/Output Last 24 hours  Intake/Output Summary (Last 24 hours) at 10/11/2018 1542 Last data filed at 10/11/2018 1507 Gross per 24 hour  Intake -  Output 380 ml  Net -380 ml    Labs/Imaging Results for orders placed or performed during the hospital encounter of 10/11/18 (from the past 48 hour(s))  CBC with Differential     Status: Abnormal   Collection Time: 10/11/18 12:12 PM  Result Value Ref Range   WBC 14.6 (H) 4.0 - 10.5 K/uL   RBC  4.04 (L) 4.22 - 5.81 MIL/uL   Hemoglobin 12.3 (L) 13.0 - 17.0 g/dL   HCT 16.137.9 (L) 09.639.0 - 04.552.0 %   MCV 93.8 80.0 - 100.0 fL   MCH 30.4 26.0 - 34.0 pg   MCHC 32.5 30.0 - 36.0 g/dL   RDW 40.913.7 81.111.5 - 91.415.5 %   Platelets 196 150 - 400 K/uL   nRBC 0.0 0.0 - 0.2 %   Neutrophils Relative % 51 %   Neutro Abs 7.5 1.7 - 7.7 K/uL   Lymphocytes Relative 36 %   Lymphs Abs 5.3 (H) 0.7 - 4.0 K/uL   Monocytes Relative 11 %   Monocytes Absolute 1.6 (H) 0.1 - 1.0 K/uL   Eosinophils Relative 0 %   Eosinophils Absolute 0.0 0.0 - 0.5 K/uL   Basophils Relative 1 %   Basophils Absolute 0.1 0.0 - 0.1 K/uL   Immature Granulocytes 1 %   Abs Immature Granulocytes 0.10 (H) 0.00 - 0.07 K/uL    Comment: Performed at [facility], [address] [place], [place]  [address]  Comprehensive metabolic panel     Status: Abnormal   Collection Time: 10/11/18 12:12 PM  Result Value Ref Range   Sodium 141 135 - 145 mmol/L   Potassium 3.6 3.5 - 5.1 mmol/L    Comment: HEMOLYSIS AT THIS LEVEL MAY AFFECT RESULT   Chloride 107 98 - 111 mmol/L   CO2 9 (L) 22 - 32 mmol/L   Glucose, Bld 212 (H) 70 - 99 mg/dL   BUN 16 6 - 20 mg/dL   Creatinine, Ser 6.211.13 0.61 - 1.24 mg/dL   Calcium 9.0 8.9 - 30.810.3 mg/dL   Total Protein 8.2 (H) 6.5 - 8.1 g/dL   Albumin 4.4 3.5 - 5.0 g/dL   AST 54 (H) 15 - 41 U/L   ALT 22 0 - 44 U/L    Comment: RESULTS CONFIRMED BY MANUAL DILUTION SNG    Alkaline Phosphatase 37 (L) 38 - 126 U/L   Total Bilirubin 0.7 0.3 - 1.2 mg/dL   GFR calc non Af Amer >60 >60 mL/min   GFR calc Af Amer >60 >60 mL/min   Anion gap 25 (H) 5 - 15    Comment: Performed at [facility], [address] [place], [place] [address]  Urinalysis, Complete w Microscopic     Status: Abnormal   Collection Time: 10/11/18 12:12 PM  Result Value Ref Range   Color, Urine YELLOW (A) YELLOW   APPearance CLEAR (A) CLEAR   Specific Gravity, Urine 1.018 1.005 - 1.030   pH 5.0 5.0 - 8.0   Glucose, UA NEGATIVE NEGATIVE mg/dL   Hgb urine dipstick NEGATIVE NEGATIVE   Bilirubin Urine NEGATIVE NEGATIVE   Ketones, ur NEGATIVE NEGATIVE mg/dL   Protein, ur 30 (A) NEGATIVE mg/dL   Nitrite NEGATIVE NEGATIVE   Leukocytes,Ua NEGATIVE NEGATIVE   RBC / HPF 0-5 0 - 5 RBC/hpf   WBC, UA 0-5 0 - 5 WBC/hpf   Bacteria, UA NONE SEEN NONE SEEN   Squamous Epithelial / LPF NONE SEEN 0 - 5   Mucus PRESENT    Hyaline Casts, UA PRESENT     Comment: Performed at [facility], [address] [place], [place] [address]   Dg Chest 2 View  Result Date: 10/11/2018 CLINICAL DATA:  Seizures over the past 2 days. EXAM: CHEST - 2 VIEW COMPARISON:  Single-view of the chest 10/08/2018. PA and lateral chest 05/26/2016. FINDINGS: Lungs clear. Heart size normal. No  pneumothorax or pleural  fluid. No acute or focal bony abnormality. IMPRESSION: Negative chest. Electronically Signed   By: Drusilla Kannerhomas  Dalessio M.D.   On: 10/11/2018 14:59    Pending Labs Unresulted Labs (From admission, onward)    Start     Ordered   10/11/18 1522  TSH  Add-on,   AD     10/11/18 1521   10/11/18 1517  RPR  Once,   R     10/11/18 1517   10/11/18 1439  Urine Culture  ONCE - STAT,   STAT     10/11/18 1439   10/11/18 1439  CULTURE, BLOOD (ROUTINE X 2) w Reflex to ID Panel  BLOOD CULTURE X 2,   STAT     10/11/18 1439   10/11/18 1234  Ethanol  Once,   STAT     10/11/18 1234   Signed and Held  HIV antibody (Routine Testing)  Once,   R     Signed and Held   Signed and Held  HIV antibody (Routine Testing)  Once,   R     Signed and Held          Vitals/Pain Today's Vitals   10/11/18 1300 10/11/18 1330 10/11/18 1400 10/11/18 1430  BP: 95/60 (!) 95/53 95/68 94/66   Pulse:      Resp: 20 17 13 15   Temp:      SpO2:      Weight:      Height:      PainSc:        Isolation Precautions No active isolations  Medications Medications  valproate (DEPACON) 500 mg in dextrose 5 % 50 mL IVPB (has no administration in time range)  LORazepam (ATIVAN) injection 1 mg (1 mg Intravenous Given 10/11/18 1204)  levETIRAcetam (KEPPRA) IVPB 1000 mg/100 mL premix (0 mg Intravenous Stopped 10/11/18 1326)    Mobility walks High fall risk   Focused Assessments seizure   R Recommendations: See Admitting Provider Note  Report given to:   Additional Notes:
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
## 2018-10-11 NOTE — Consult Note (Signed)
Patient is a 28 year old male admitted with seizures and noncompliance with his seizure medication leading to frequent seizures.  Patient also has a history of schizoaffective disorder for which a psychiatry consult was placed.  Patient was just in the ED on 10/08/2018 for his seizures and was discharged with his medications.  However it appears that patient has been noncompliant and came into the ED with another seizure. Patient currently is postictal and sleeping and unable to conduct a psychiatric assessment.  Patient is most likely to be admitted to the medical floor and psychiatry will follow-up once patient is awake.

## 2018-10-11 NOTE — ED Notes (Signed)
Pt resting comfortably. Red tube was hemolyzed. Had visible fat when drawn.
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
## 2018-10-11 NOTE — ED Notes (Signed)
Pt in xr. Spoke with admitting dr and they will put in a sitter order.

## 2018-10-11 NOTE — ED Triage Notes (Signed)
Pt was seen yesterday for seizures - still has rx in his bag. Was getting on bus to [place] and had seizure. Pt is postical at this time

## 2018-10-12 ENCOUNTER — Other Ambulatory Visit: Payer: Self-pay

## 2018-10-12 ENCOUNTER — Inpatient Hospital Stay: Payer: Medicaid Other

## 2018-10-12 DIAGNOSIS — G40419 Other generalized epilepsy and epileptic syndromes, intractable, without status epilepticus: Secondary | ICD-10-CM

## 2018-10-12 DIAGNOSIS — R569 Unspecified convulsions: Secondary | ICD-10-CM

## 2018-10-12 LAB — URINE CULTURE: Culture: NO GROWTH

## 2018-10-12 LAB — RPR: RPR Ser Ql: NONREACTIVE

## 2018-10-12 LAB — ETHANOL: Alcohol, Ethyl (B): <10 mg/dL (ref <10)

## 2018-10-12 NOTE — Procedures (Signed)
History: 28 year old male being evaluated for seizures  Sedation: None  Technique: This is a 21 channel routine scalp EEG performed at the bedside with bipolar and monopolar montages arranged in accordance to the international 10/20 system of electrode placement. One channel was dedicated to EKG recording.    Background: The background consists of intermixed alpha and beta activities. There is a well defined posterior dominant rhythm of 8-9 Hz that attenuates with eye opening.  There are occasional generalized spike and wave, spike, or polyspike discharges that were seen in isolation.  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  There was no seizure recorded on this study. Please note that lack of epileptiform activity on EEG does not preclude the possibility of epilepsy.   Roland Rack, MD [facility] [phone]  If 7pm- 7am, please page neurology on call as listed in [place].
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
## 2018-10-12 NOTE — Progress Notes (Signed)
eeg completed ° °
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
## 2018-10-13 LAB — MAGNESIUM: Magnesium: 2.1 mg/dL (ref 1.7–2.4)

## 2018-10-13 LAB — CBC
HCT: 31 % — ABNORMAL LOW (ref 39.0–52.0)
Hemoglobin: 10.6 g/dL — ABNORMAL LOW (ref 13.0–17.0)
MCH: 30.5 pg (ref 26.0–34.0)
MCHC: 34.2 g/dL (ref 30.0–36.0)
MCV: 89.3 fL (ref 80.0–100.0)
Platelets: 147 10*3/uL — ABNORMAL LOW (ref 150–400)
RBC: 3.47 MIL/uL — ABNORMAL LOW (ref 4.22–5.81)
RDW: 13.7 % (ref 11.5–15.5)
WBC: 6.2 10*3/uL (ref 4.0–10.5)
nRBC: 0 % (ref 0.0–0.2)

## 2018-10-13 LAB — CULTURE, BLOOD (ROUTINE X 2)
Culture: NO GROWTH
Culture: NO GROWTH
Special Requests: ADEQUATE

## 2018-10-13 LAB — BASIC METABOLIC PANEL
Anion gap: 8 (ref 5–15)
BUN: 9 mg/dL (ref 6–20)
CO2: 24 mmol/L (ref 22–32)
Calcium: 8.4 mg/dL — ABNORMAL LOW (ref 8.9–10.3)
Chloride: 110 mmol/L (ref 98–111)
Creatinine, Ser: 0.84 mg/dL (ref 0.61–1.24)
GFR calc Af Amer: >60 mL/min (ref >60)
GFR calc non Af Amer: >60 mL/min (ref >60)
Glucose, Bld: 89 mg/dL (ref 70–99)
Potassium: 2.9 mmol/L — ABNORMAL LOW (ref 3.5–5.1)
Sodium: 142 mmol/L (ref 135–145)

## 2018-10-13 LAB — HIV ANTIBODY (ROUTINE TESTING W REFLEX): HIV Screen 4th Generation wRfx: NONREACTIVE

## 2018-10-13 LAB — VALPROIC ACID LEVEL: Valproic Acid Lvl: 54 ug/mL (ref 50.0–100.0)

## 2018-10-13 MED ORDER — DIVALPROEX SODIUM 500 MG PO DR TAB: 500.0000 mg | DELAYED_RELEASE_TABLET | Freq: Two times a day (BID) | ORAL | 0 refills | Status: AC

## 2018-10-13 MED ORDER — POTASSIUM CHLORIDE CRYS ER 20 MEQ PO TBCR
40.0000 meq | EXTENDED_RELEASE_TABLET | ORAL | Status: AC
  Administered 2018-10-13 (×2): 40 meq via ORAL
  Filled 2018-10-13 (×2): qty 2
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
## 2018-10-13 NOTE — Plan of Care (Signed)
  Problem: Clinical Measurements: Goal: Diagnostic test results will improve Outcome: Not Progressing  Potassium = 2.9 this am

## 2018-10-13 NOTE — Discharge Instructions (Signed)
Do not drive, operate heavy machinery, perform activities at heights and participate in water activities until release by outpatient physician.

## 2018-10-14 LAB — BASIC METABOLIC PANEL
Anion gap: 6 (ref 5–15)
BUN: 11 mg/dL (ref 6–20)
CO2: 24 mmol/L (ref 22–32)
Calcium: 8.3 mg/dL — ABNORMAL LOW (ref 8.9–10.3)
Chloride: 112 mmol/L — ABNORMAL HIGH (ref 98–111)
Creatinine, Ser: 0.88 mg/dL (ref 0.61–1.24)
GFR calc Af Amer: >60 mL/min (ref >60)
GFR calc non Af Amer: >60 mL/min (ref >60)
Glucose, Bld: 95 mg/dL (ref 70–99)
Potassium: 3.4 mmol/L — ABNORMAL LOW (ref 3.5–5.1)
Sodium: 142 mmol/L (ref 135–145)

## 2018-10-14 NOTE — Plan of Care (Signed)
  Problem: Education: Goal: Expressions of having a comfortable level of knowledge regarding the disease process will increase Outcome: Adequate for Discharge   Problem: Coping: Goal: Ability to adjust to condition or change in health will improve Outcome: Adequate for Discharge Goal: Ability to identify appropriate support needs will improve Outcome: Adequate for Discharge   Problem: Health Behavior/Discharge Planning: Goal: Compliance with prescribed medication regimen will improve Outcome: Adequate for Discharge   Problem: Medication: Goal: Risk for medication side effects will decrease Outcome: Adequate for Discharge   Problem: Clinical Measurements: Goal: Complications related to the disease process, condition or treatment will be avoided or minimized Outcome: Adequate for Discharge Goal: Diagnostic test results will improve Outcome: Adequate for Discharge   Problem: Safety: Goal: Verbalization of understanding the information provided will improve Outcome: Adequate for Discharge   Problem: Self-Concept: Goal: Level of anxiety will decrease Outcome: Adequate for Discharge Goal: Ability to verbalize feelings about condition will improve Outcome: Adequate for Discharge   Problem: Education: Goal: Knowledge of General Education information will improve Description: Including pain rating scale, medication(s)/side effects and non-pharmacologic comfort measures Outcome: Adequate for Discharge   Problem: Health Behavior/Discharge Planning: Goal: Ability to manage health-related needs will improve Outcome: Adequate for Discharge   Problem: Clinical Measurements: Goal: Ability to maintain clinical measurements within normal limits will improve Outcome: Adequate for Discharge Goal: Will remain free from infection Outcome: Adequate for Discharge Goal: Diagnostic test results will improve Outcome: Adequate for Discharge Goal: Respiratory complications will improve Outcome:  Adequate for Discharge Goal: Cardiovascular complication will be avoided Outcome: Adequate for Discharge   Problem: Activity: Goal: Risk for activity intolerance will decrease Outcome: Adequate for Discharge   Problem: Nutrition: Goal: Adequate nutrition will be maintained Outcome: Adequate for Discharge   Problem: Coping: Goal: Level of anxiety will decrease Outcome: Adequate for Discharge   Problem: Elimination: Goal: Will not experience complications related to bowel motility Outcome: Adequate for Discharge Goal: Will not experience complications related to urinary retention Outcome: Adequate for Discharge   Problem: Pain Managment: Goal: General experience of comfort will improve Outcome: Adequate for Discharge   Problem: Safety: Goal: Ability to remain free from injury will improve Outcome: Adequate for Discharge   Problem: Skin Integrity: Goal: Risk for impaired skin integrity will decrease Outcome: Adequate for Discharge   
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
## 2018-10-14 NOTE — Progress Notes (Signed)
Telephone call to Care Management spoke with Merrily Pew, RN regarding discharge plans for the pt including transportation to [place], [place] and financially obtaining his Rx; to call back when these issues have been resolved

## 2018-10-14 NOTE — Progress Notes (Signed)
Ladona Mow taxi at [employer] entrance for pt discharge; pt discharged via wheelchair to the [employer] entrance; taxi voucher given to [employer] taxi driver

## 2018-10-14 NOTE — Progress Notes (Signed)
Discharge home order in [facility] by Dr Darvin Neighbours; Care Management previously established discharge ride with Ladona Mow to [place], [place]  [address] for the pt to get his Rx filled; Sales executive previously reviewed the AVS with the pt; went into Room 108 to ask if he had any discharge questions, "no" per pt; verbally advised him that when Ladona Mow arrived at the [place] entrance we would take him out in a wheelchair
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
## 2018-10-16 LAB — CULTURE, BLOOD (ROUTINE X 2)
Culture: NO GROWTH
Culture: NO GROWTH
Special Requests: ADEQUATE
Special Requests: ADEQUATE
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
## 2019-02-01 IMAGING — DX DG CHEST 2V
2 series · 2 of 2 positions shown · non-contrast
Comparison: 10/29/2015

CLINICAL DATA: Mid chest pain and vomiting. Vomiting blood today.
Smoker.

EXAM:
CHEST  2 VIEW

[chest pa]
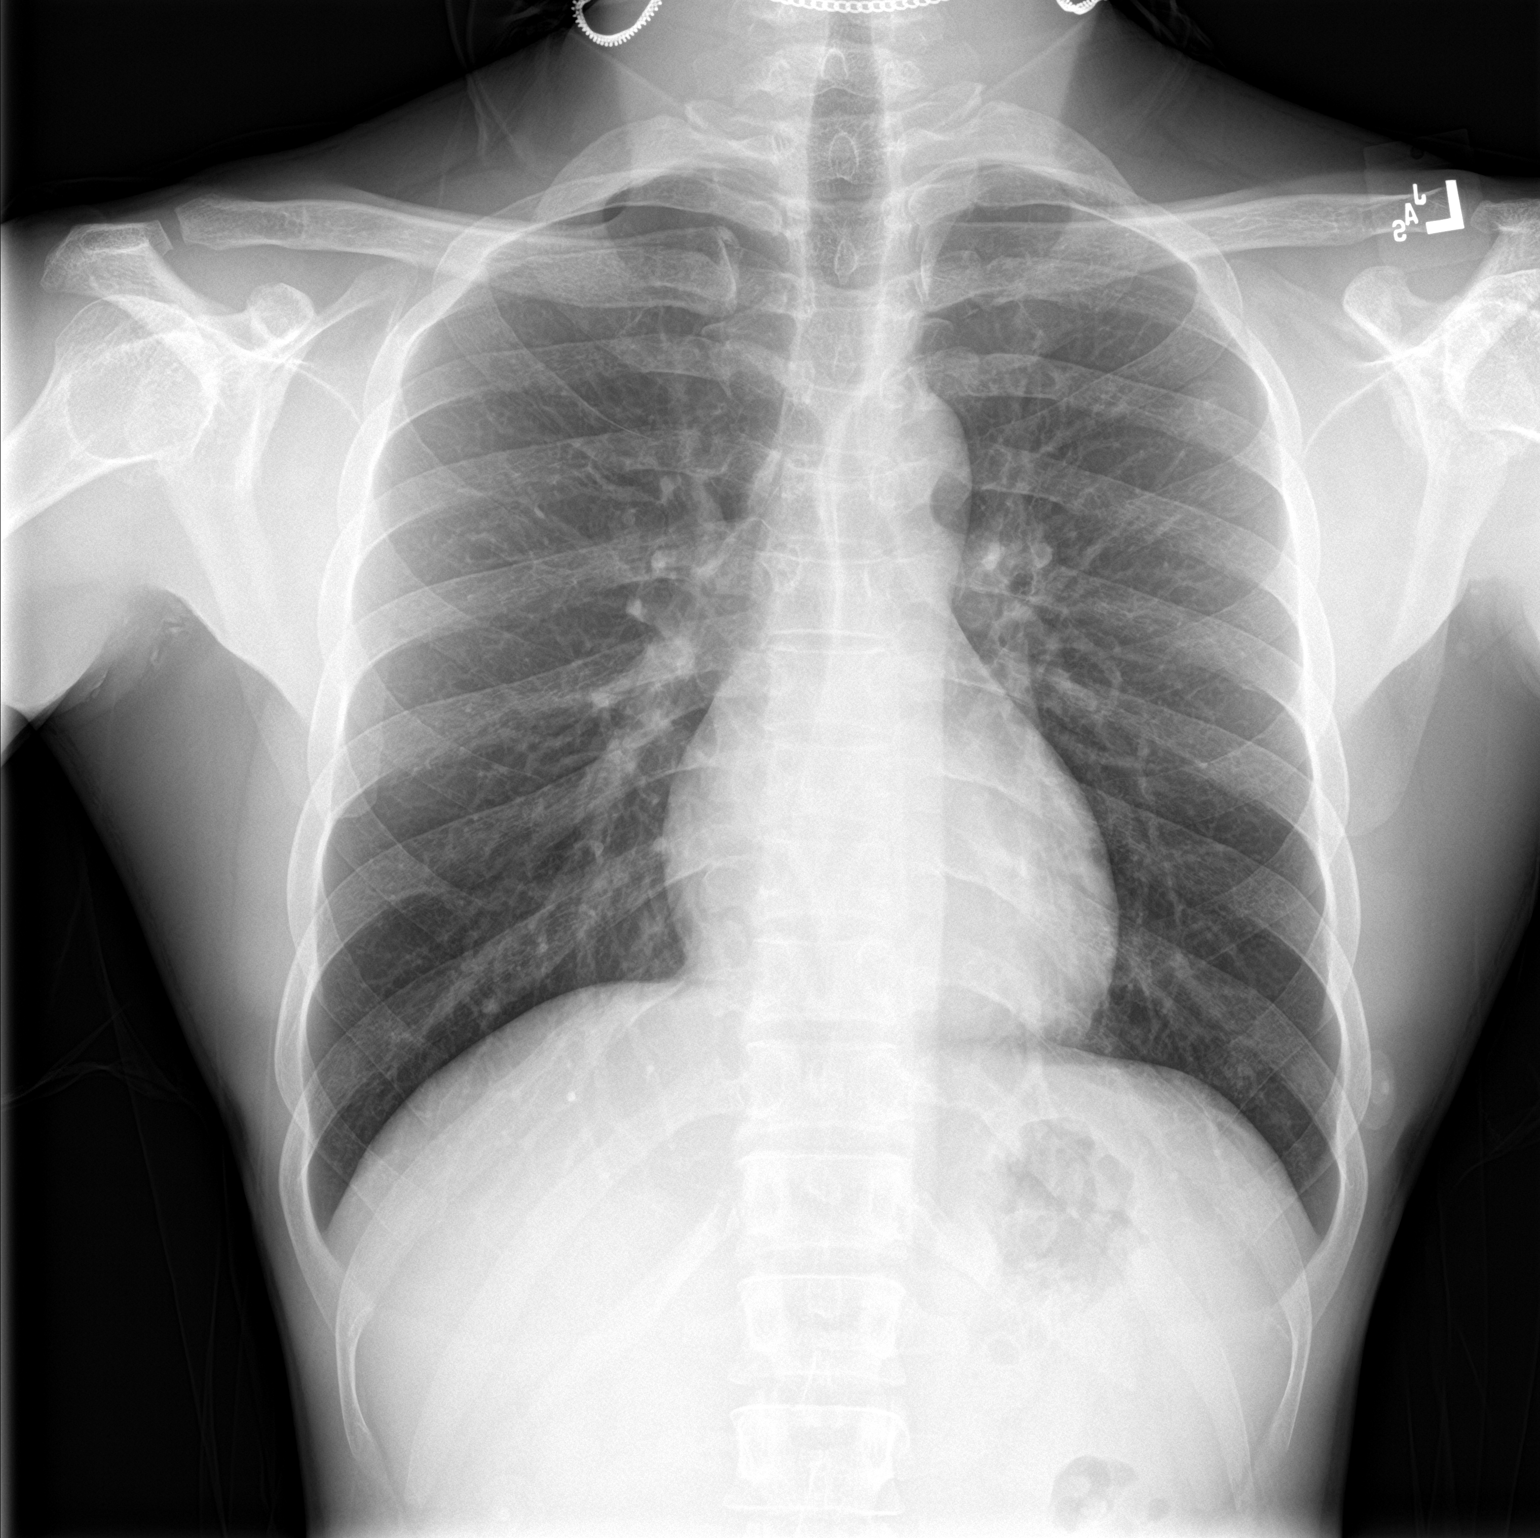

[chest lat]
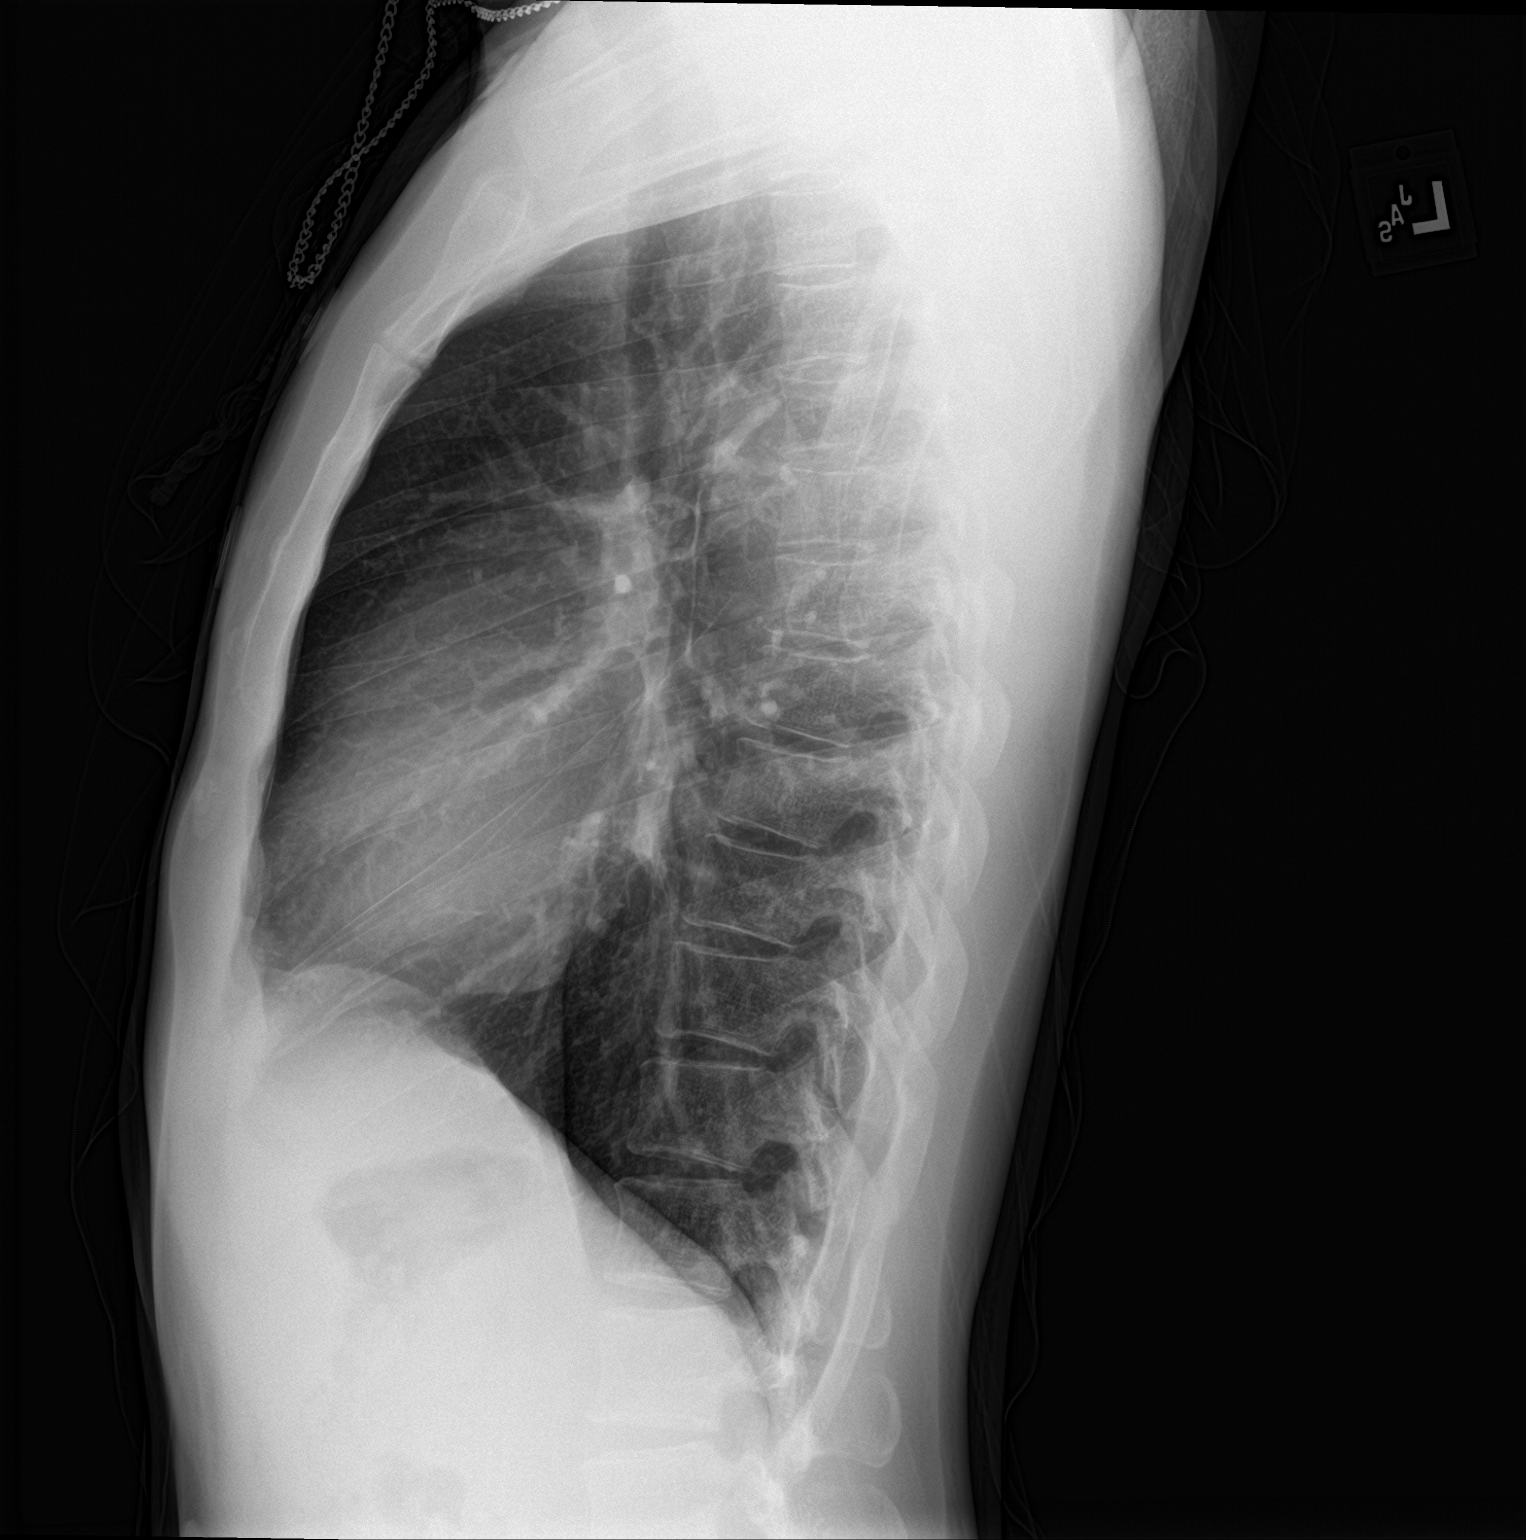

[2 of 2 positions shown; findings below may reference images not displayed]

FINDINGS: The heart size and mediastinal contours are within normal limits.
Both lungs are clear. The visualized skeletal structures are
unremarkable.
IMPRESSION: No active cardiopulmonary disease.
